# Patient Record
Sex: Female | Born: 1935 | Race: Black or African American | Hispanic: No | Smoking: Never smoker
Health system: Southern US, Community
[De-identification: ages and names within clinical notes are randomized; demographics above are authoritative.]

## PROBLEM LIST (undated history)

## (undated) DIAGNOSIS — D518 Other vitamin B12 deficiency anemias: Secondary | ICD-10-CM

## (undated) DIAGNOSIS — N183 Chronic kidney disease, stage 3 unspecified: Secondary | ICD-10-CM

## (undated) DIAGNOSIS — I739 Peripheral vascular disease, unspecified: Secondary | ICD-10-CM

## (undated) DIAGNOSIS — F29 Unspecified psychosis not due to a substance or known physiological condition: Secondary | ICD-10-CM

## (undated) DIAGNOSIS — E785 Hyperlipidemia, unspecified: Secondary | ICD-10-CM

## (undated) DIAGNOSIS — G309 Alzheimer's disease, unspecified: Secondary | ICD-10-CM

## (undated) DIAGNOSIS — M159 Polyosteoarthritis, unspecified: Secondary | ICD-10-CM

## (undated) DIAGNOSIS — E46 Unspecified protein-calorie malnutrition: Secondary | ICD-10-CM

## (undated) DIAGNOSIS — D509 Iron deficiency anemia, unspecified: Secondary | ICD-10-CM

## (undated) DIAGNOSIS — H04129 Dry eye syndrome of unspecified lacrimal gland: Secondary | ICD-10-CM

## (undated) DIAGNOSIS — I1310 Hypertensive heart and chronic kidney disease without heart failure, with stage 1 through stage 4 chronic kidney disease, or unspecified chronic kidney disease: Secondary | ICD-10-CM

## (undated) DIAGNOSIS — I10 Essential (primary) hypertension: Secondary | ICD-10-CM

## (undated) DIAGNOSIS — J309 Allergic rhinitis, unspecified: Secondary | ICD-10-CM

## (undated) DIAGNOSIS — M810 Age-related osteoporosis without current pathological fracture: Secondary | ICD-10-CM

## (undated) DIAGNOSIS — F0280 Dementia in other diseases classified elsewhere without behavioral disturbance: Secondary | ICD-10-CM

## (undated) HISTORY — DX: Other vitamin B12 deficiency anemias: D51.8

## (undated) HISTORY — DX: Unspecified psychosis not due to a substance or known physiological condition: F29

## (undated) HISTORY — DX: Peripheral vascular disease, unspecified: I73.9

## (undated) HISTORY — DX: Chronic kidney disease, stage 3 (moderate): N18.3

## (undated) HISTORY — DX: Allergic rhinitis, unspecified: J30.9

## (undated) HISTORY — DX: Dementia in other diseases classified elsewhere, unspecified severity, without behavioral disturbance, psychotic disturbance, mood disturbance, and anxiety: F02.80

## (undated) HISTORY — DX: Chronic kidney disease, stage 3 unspecified: N18.30

## (undated) HISTORY — DX: Hyperlipidemia, unspecified: E78.5

## (undated) HISTORY — DX: Age-related osteoporosis without current pathological fracture: M81.0

## (undated) HISTORY — DX: Hypertensive heart and chronic kidney disease without heart failure, with stage 1 through stage 4 chronic kidney disease, or unspecified chronic kidney disease: I13.10

## (undated) HISTORY — DX: Unspecified protein-calorie malnutrition: E46

## (undated) HISTORY — DX: Polyosteoarthritis, unspecified: M15.9

## (undated) HISTORY — DX: Iron deficiency anemia, unspecified: D50.9

## (undated) HISTORY — DX: Essential (primary) hypertension: I10

## (undated) HISTORY — DX: Alzheimer's disease, unspecified: G30.9

## (undated) HISTORY — DX: Dry eye syndrome of unspecified lacrimal gland: H04.129

---

## 2005-07-10 ENCOUNTER — Emergency Department: Payer: Self-pay | Admitting: Emergency Medicine

## 2006-01-26 ENCOUNTER — Other Ambulatory Visit: Payer: Self-pay

## 2006-01-27 ENCOUNTER — Inpatient Hospital Stay: Payer: Self-pay | Admitting: Internal Medicine

## 2006-04-14 ENCOUNTER — Ambulatory Visit: Payer: Self-pay | Admitting: Internal Medicine

## 2006-05-26 ENCOUNTER — Ambulatory Visit: Payer: Self-pay | Admitting: Internal Medicine

## 2008-03-16 ENCOUNTER — Telehealth (INDEPENDENT_AMBULATORY_CARE_PROVIDER_SITE_OTHER): Payer: Self-pay | Admitting: *Deleted

## 2008-04-24 ENCOUNTER — Telehealth (INDEPENDENT_AMBULATORY_CARE_PROVIDER_SITE_OTHER): Payer: Self-pay | Admitting: *Deleted

## 2008-04-24 ENCOUNTER — Encounter (INDEPENDENT_AMBULATORY_CARE_PROVIDER_SITE_OTHER): Payer: Self-pay | Admitting: *Deleted

## 2008-05-23 ENCOUNTER — Telehealth (INDEPENDENT_AMBULATORY_CARE_PROVIDER_SITE_OTHER): Payer: Self-pay | Admitting: *Deleted

## 2008-06-22 DIAGNOSIS — M129 Arthropathy, unspecified: Secondary | ICD-10-CM | POA: Insufficient documentation

## 2008-06-22 DIAGNOSIS — I10 Essential (primary) hypertension: Secondary | ICD-10-CM | POA: Insufficient documentation

## 2008-06-22 DIAGNOSIS — M4000 Postural kyphosis, site unspecified: Secondary | ICD-10-CM | POA: Insufficient documentation

## 2008-06-22 DIAGNOSIS — R634 Abnormal weight loss: Secondary | ICD-10-CM

## 2008-06-22 DIAGNOSIS — M25559 Pain in unspecified hip: Secondary | ICD-10-CM

## 2008-06-23 DIAGNOSIS — M1990 Unspecified osteoarthritis, unspecified site: Secondary | ICD-10-CM | POA: Insufficient documentation

## 2008-07-04 ENCOUNTER — Telehealth: Payer: Self-pay | Admitting: Internal Medicine

## 2008-09-15 ENCOUNTER — Telehealth: Payer: Self-pay | Admitting: Internal Medicine

## 2008-09-29 ENCOUNTER — Ambulatory Visit: Payer: Self-pay | Admitting: Family Medicine

## 2008-09-29 DIAGNOSIS — M533 Sacrococcygeal disorders, not elsewhere classified: Secondary | ICD-10-CM | POA: Insufficient documentation

## 2008-09-29 DIAGNOSIS — M810 Age-related osteoporosis without current pathological fracture: Secondary | ICD-10-CM | POA: Insufficient documentation

## 2008-10-10 ENCOUNTER — Ambulatory Visit: Payer: Self-pay | Admitting: Family Medicine

## 2008-10-10 LAB — CONVERTED CEMR LAB

## 2008-10-18 ENCOUNTER — Telehealth: Payer: Self-pay | Admitting: Family Medicine

## 2008-10-20 DIAGNOSIS — E875 Hyperkalemia: Secondary | ICD-10-CM

## 2008-10-20 LAB — CONVERTED CEMR LAB
ALT: 12 units/L (ref 0–35)
AST: 21 units/L (ref 0–37)
Basophils Absolute: 0.1 10*3/uL (ref 0.0–0.1)
Bilirubin, Direct: 0.1 mg/dL (ref 0.0–0.3)
CO2: 24 meq/L (ref 19–32)
Chloride: 113 meq/L — ABNORMAL HIGH (ref 96–112)
Eosinophils Absolute: 0.1 10*3/uL (ref 0.0–0.7)
GFR calc non Af Amer: 41 mL/min
HDL: 64.9 mg/dL (ref >39.0)
Lymphocytes Relative: 16 % (ref 12.0–46.0)
MCHC: 33.7 g/dL (ref 30.0–36.0)
Neutrophils Relative %: 73 % (ref 43.0–77.0)
Platelets: 208 10*3/uL (ref 150–400)
Potassium: 5.6 meq/L — ABNORMAL HIGH (ref 3.5–5.1)
RBC: 3.85 M/uL — ABNORMAL LOW (ref 3.87–5.11)
RDW: 12.2 % (ref 11.5–14.6)
Sodium: 143 meq/L (ref 135–145)
Total Bilirubin: 0.8 mg/dL (ref 0.3–1.2)
Triglycerides: 128 mg/dL (ref 0–149)
VLDL: 26 mg/dL (ref 0–40)

## 2008-11-01 ENCOUNTER — Ambulatory Visit: Payer: Self-pay | Admitting: Family Medicine

## 2008-11-01 DIAGNOSIS — E538 Deficiency of other specified B group vitamins: Secondary | ICD-10-CM | POA: Insufficient documentation

## 2008-11-02 LAB — CONVERTED CEMR LAB
CO2: 24 meq/L (ref 19–32)
Calcium: 9.8 mg/dL (ref 8.4–10.5)
GFR calc Af Amer: 39 mL/min
Glucose, Bld: 91 mg/dL (ref 70–99)
Potassium: 5.4 meq/L — ABNORMAL HIGH (ref 3.5–5.1)
Sodium: 142 meq/L (ref 135–145)

## 2008-11-12 DIAGNOSIS — F068 Other specified mental disorders due to known physiological condition: Secondary | ICD-10-CM

## 2009-01-02 ENCOUNTER — Telehealth: Payer: Self-pay | Admitting: Family Medicine

## 2009-02-01 ENCOUNTER — Ambulatory Visit: Payer: Self-pay | Admitting: Family Medicine

## 2009-02-01 DIAGNOSIS — R413 Other amnesia: Secondary | ICD-10-CM | POA: Insufficient documentation

## 2009-02-01 DIAGNOSIS — R609 Edema, unspecified: Secondary | ICD-10-CM

## 2009-02-05 LAB — CONVERTED CEMR LAB
BUN: 27 mg/dL — ABNORMAL HIGH (ref 6–23)
Chloride: 108 meq/L (ref 96–112)
GFR calc non Af Amer: 45.16 mL/min (ref >60)
Glucose, Bld: 94 mg/dL (ref 70–99)
Potassium: 4.4 meq/L (ref 3.5–5.1)
Sodium: 142 meq/L (ref 135–145)
Vitamin B-12: 323 pg/mL (ref 211–911)

## 2009-03-12 ENCOUNTER — Telehealth: Payer: Self-pay | Admitting: Family Medicine

## 2009-06-02 ENCOUNTER — Encounter: Payer: Self-pay | Admitting: Internal Medicine

## 2009-06-02 ENCOUNTER — Inpatient Hospital Stay (HOSPITAL_COMMUNITY): Admission: EM | Admit: 2009-06-02 | Discharge: 2009-06-06 | Payer: Self-pay | Admitting: Emergency Medicine

## 2009-06-02 DIAGNOSIS — R5383 Other fatigue: Secondary | ICD-10-CM

## 2009-06-02 DIAGNOSIS — E876 Hypokalemia: Secondary | ICD-10-CM

## 2009-06-02 DIAGNOSIS — R5381 Other malaise: Secondary | ICD-10-CM

## 2009-06-02 DIAGNOSIS — E860 Dehydration: Secondary | ICD-10-CM | POA: Insufficient documentation

## 2009-06-02 DIAGNOSIS — D72829 Elevated white blood cell count, unspecified: Secondary | ICD-10-CM | POA: Insufficient documentation

## 2009-06-04 ENCOUNTER — Ambulatory Visit: Payer: Self-pay | Admitting: Internal Medicine

## 2009-06-06 ENCOUNTER — Encounter: Payer: Self-pay | Admitting: Internal Medicine

## 2009-06-14 ENCOUNTER — Telehealth: Payer: Self-pay | Admitting: Family Medicine

## 2009-06-14 DIAGNOSIS — N814 Uterovaginal prolapse, unspecified: Secondary | ICD-10-CM | POA: Insufficient documentation

## 2009-06-17 ENCOUNTER — Encounter: Payer: Self-pay | Admitting: Internal Medicine

## 2009-06-23 ENCOUNTER — Emergency Department: Payer: Self-pay | Admitting: Unknown Physician Specialty

## 2009-07-06 ENCOUNTER — Emergency Department: Payer: Self-pay | Admitting: Emergency Medicine

## 2009-07-18 ENCOUNTER — Inpatient Hospital Stay: Payer: Self-pay | Admitting: Internal Medicine

## 2009-11-25 IMAGING — CR DG ABDOMEN 1V
1 series · 1 of 1 positions shown · non-contrast
Comparison: none

REASON FOR EXAM: clinical rectal prolapse
COMMENTS:   LMP: Post-Menopausal

PROCEDURE:     DXR - DXR KIDNEY URETER BLADDER  - July 06, 2009  [DATE]
RESULT:     Comparisons:  None

[view not recorded]
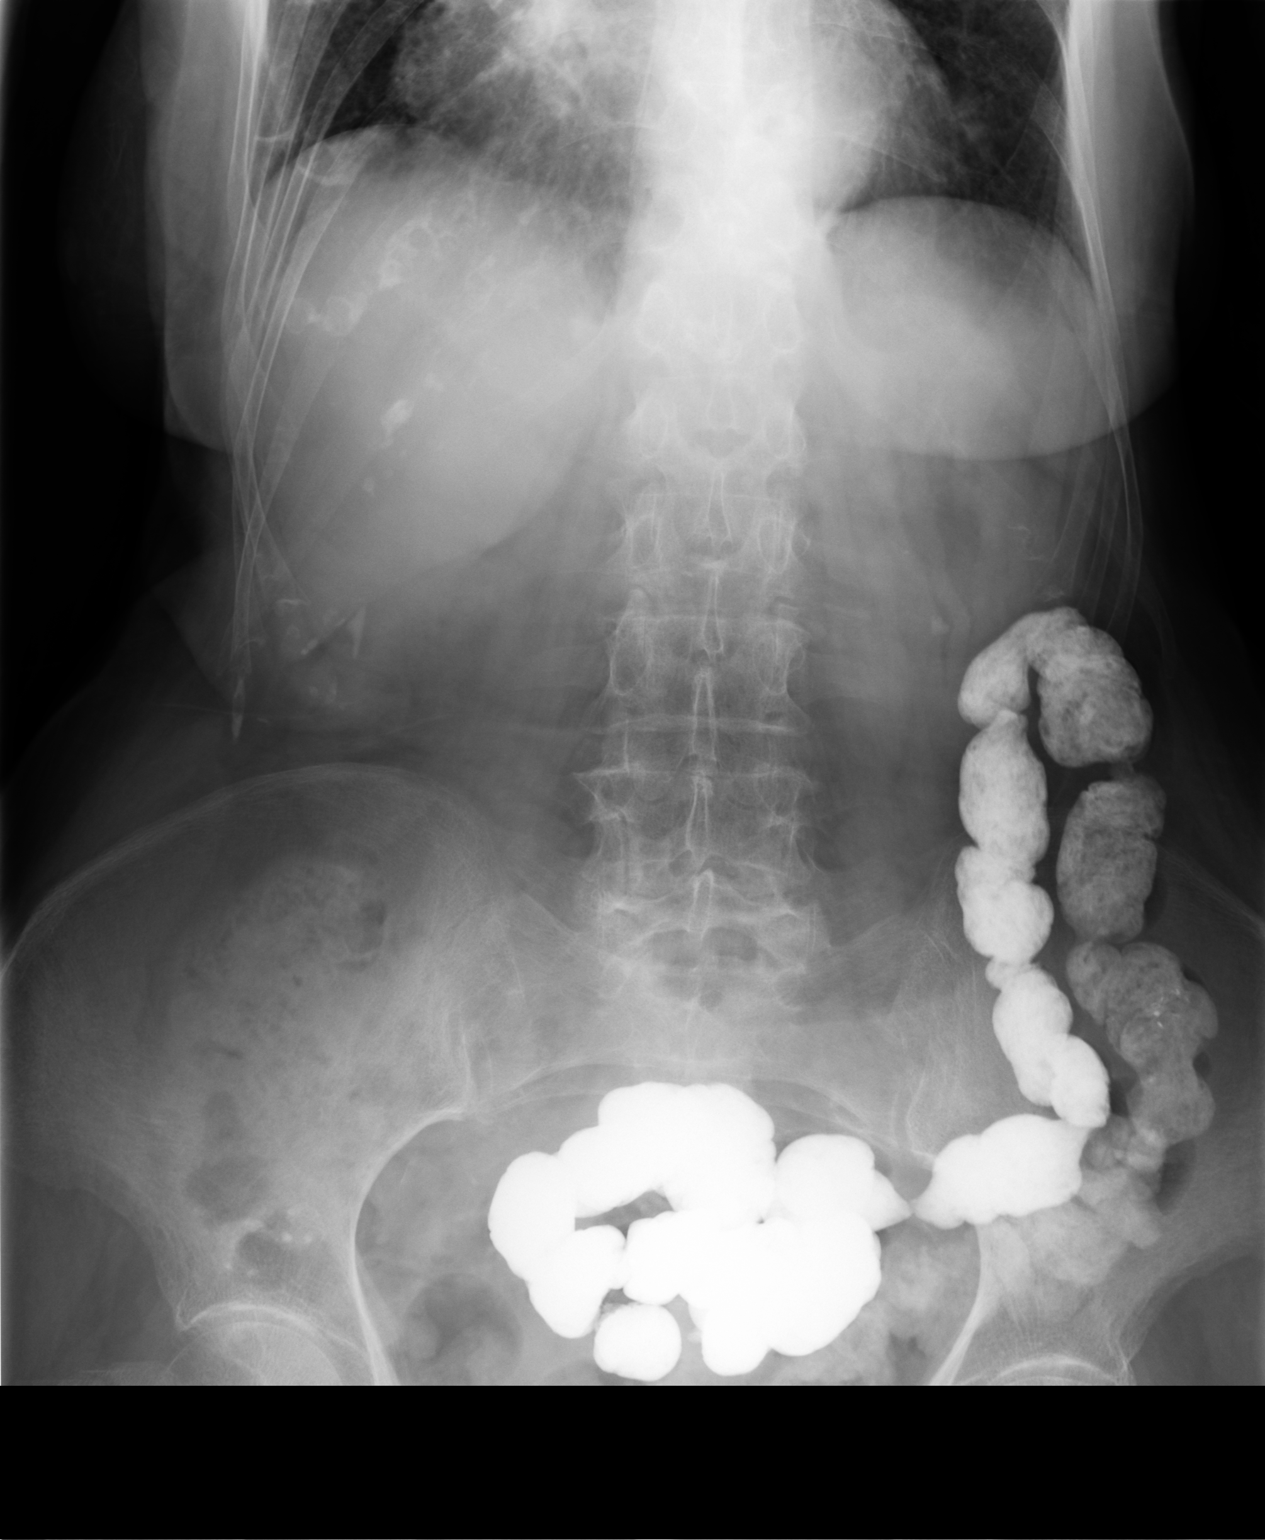

[1 of 1 positions shown; findings below may reference images not displayed]

FINDINGS: Supine radiograph of the abdomen is provided.

There is a nonspecific bowel gas pattern. There is no bowel dilatation to
suggest obstruction. There is contrast material within the rectum, sigmoid
colon, and distal descending colon which may be from prior oral contrast
material administration versus inspissated stool. There is no pathologic
calcification along the expected course of the ureters. There is no evidence
of pneumoperitoneum, portal venous gas, or pneumatosis.

The osseous structures are unremarkable.
IMPRESSION: There is contrast material within the rectum, sigmoid colon, and distal
descending colon which may be from prior oral contrast material
administration versus inspissated stool.

## 2011-02-23 LAB — DIFFERENTIAL
Basophils Absolute: 0.1 10*3/uL (ref 0.0–0.1)
Eosinophils Relative: 0 % (ref 0–5)
Lymphocytes Relative: 7 % — ABNORMAL LOW (ref 12–46)
Lymphs Abs: 0.8 10*3/uL (ref 0.7–4.0)
Monocytes Absolute: 0.9 10*3/uL (ref 0.1–1.0)
Neutro Abs: 9.5 10*3/uL — ABNORMAL HIGH (ref 1.7–7.7)

## 2011-02-23 LAB — URINALYSIS, ROUTINE W REFLEX MICROSCOPIC
Bilirubin Urine: NEGATIVE
Nitrite: NEGATIVE
Specific Gravity, Urine: 1.017 (ref 1.005–1.030)
Urobilinogen, UA: 0.2 mg/dL (ref 0.0–1.0)
pH: 5.5 (ref 5.0–8.0)

## 2011-02-23 LAB — BASIC METABOLIC PANEL
BUN: 12 mg/dL (ref 6–23)
CO2: 24 mEq/L (ref 19–32)
CO2: 27 mEq/L (ref 19–32)
Calcium: 7.9 mg/dL — ABNORMAL LOW (ref 8.4–10.5)
Calcium: 7.9 mg/dL — ABNORMAL LOW (ref 8.4–10.5)
Chloride: 107 mEq/L (ref 96–112)
Chloride: 115 mEq/L — ABNORMAL HIGH (ref 96–112)
Creatinine, Ser: 1.07 mg/dL (ref 0.4–1.2)
Creatinine, Ser: 1.24 mg/dL — ABNORMAL HIGH (ref 0.4–1.2)
GFR calc Af Amer: 50 mL/min — ABNORMAL LOW (ref >60)
GFR calc Af Amer: 59 mL/min — ABNORMAL LOW (ref >60)
Potassium: 3.5 mEq/L (ref 3.5–5.1)
Sodium: 138 mEq/L (ref 135–145)
Sodium: 139 mEq/L (ref 135–145)

## 2011-02-23 LAB — COMPREHENSIVE METABOLIC PANEL
AST: 53 U/L — ABNORMAL HIGH (ref 0–37)
Albumin: 3.1 g/dL — ABNORMAL LOW (ref 3.5–5.2)
BUN: 16 mg/dL (ref 6–23)
Calcium: 8.8 mg/dL (ref 8.4–10.5)
Creatinine, Ser: 1.32 mg/dL — ABNORMAL HIGH (ref 0.4–1.2)
GFR calc Af Amer: 46 mL/min — ABNORMAL LOW (ref >60)
Total Bilirubin: 0.6 mg/dL (ref 0.3–1.2)
Total Protein: 6.2 g/dL (ref 6.0–8.3)

## 2011-02-23 LAB — CBC
HCT: 30.3 % — ABNORMAL LOW (ref 36.0–46.0)
HCT: 34.2 % — ABNORMAL LOW (ref 36.0–46.0)
Hemoglobin: 10 g/dL — ABNORMAL LOW (ref 12.0–15.0)
MCHC: 32.6 g/dL (ref 30.0–36.0)
MCHC: 32.8 g/dL (ref 30.0–36.0)
MCHC: 33.3 g/dL (ref 30.0–36.0)
MCV: 90.9 fL (ref 78.0–100.0)
MCV: 91 fL (ref 78.0–100.0)
MCV: 91.1 fL (ref 78.0–100.0)
Platelets: 264 10*3/uL (ref 150–400)
Platelets: 303 10*3/uL (ref 150–400)
RBC: 3.34 MIL/uL — ABNORMAL LOW (ref 3.87–5.11)
RBC: 3.5 MIL/uL — ABNORMAL LOW (ref 3.87–5.11)
RDW: 13.5 % (ref 11.5–15.5)
WBC: 11.4 10*3/uL — ABNORMAL HIGH (ref 4.0–10.5)
WBC: 14.1 10*3/uL — ABNORMAL HIGH (ref 4.0–10.5)
WBC: 9.1 10*3/uL (ref 4.0–10.5)

## 2011-02-23 LAB — URINE CULTURE: Colony Count: NO GROWTH

## 2011-02-23 LAB — CK TOTAL AND CKMB (NOT AT ARMC)
CK, MB: 22.2 ng/mL — ABNORMAL HIGH (ref 0.3–4.0)
Relative Index: 3.5 — ABNORMAL HIGH (ref 0.0–2.5)

## 2011-02-23 LAB — SEDIMENTATION RATE: Sed Rate: 33 mm/hr — ABNORMAL HIGH (ref 0–22)

## 2011-02-23 LAB — TSH: TSH: 4.748 u[IU]/mL — ABNORMAL HIGH (ref 0.350–4.500)

## 2011-03-06 ENCOUNTER — Inpatient Hospital Stay (HOSPITAL_COMMUNITY)
Admission: EM | Admit: 2011-03-06 | Discharge: 2011-03-08 | DRG: 812 | Disposition: A | Discharge status: Skilled Nursing Facility | Payer: Medicare Other | Attending: Family Medicine | Admitting: Family Medicine

## 2011-03-06 DIAGNOSIS — G309 Alzheimer's disease, unspecified: Secondary | ICD-10-CM | POA: Diagnosis present

## 2011-03-06 DIAGNOSIS — F0280 Dementia in other diseases classified elsewhere without behavioral disturbance: Secondary | ICD-10-CM | POA: Diagnosis present

## 2011-03-06 DIAGNOSIS — K623 Rectal prolapse: Secondary | ICD-10-CM | POA: Diagnosis present

## 2011-03-06 DIAGNOSIS — K5900 Constipation, unspecified: Secondary | ICD-10-CM | POA: Diagnosis present

## 2011-03-06 DIAGNOSIS — N183 Chronic kidney disease, stage 3 unspecified: Secondary | ICD-10-CM | POA: Diagnosis present

## 2011-03-06 DIAGNOSIS — Z8673 Personal history of transient ischemic attack (TIA), and cerebral infarction without residual deficits: Secondary | ICD-10-CM

## 2011-03-06 DIAGNOSIS — Z66 Do not resuscitate: Secondary | ICD-10-CM | POA: Diagnosis present

## 2011-03-06 DIAGNOSIS — I129 Hypertensive chronic kidney disease with stage 1 through stage 4 chronic kidney disease, or unspecified chronic kidney disease: Secondary | ICD-10-CM | POA: Diagnosis present

## 2011-03-06 DIAGNOSIS — N390 Urinary tract infection, site not specified: Secondary | ICD-10-CM | POA: Diagnosis present

## 2011-03-06 DIAGNOSIS — D531 Other megaloblastic anemias, not elsewhere classified: Principal | ICD-10-CM | POA: Diagnosis present

## 2011-03-06 LAB — VITAMIN B12: Vitamin B-12: 204 pg/mL — ABNORMAL LOW (ref 211–911)

## 2011-03-06 LAB — COMPREHENSIVE METABOLIC PANEL
ALT: 11 U/L (ref 0–35)
AST: 16 U/L (ref 0–37)
Albumin: 3.3 g/dL — ABNORMAL LOW (ref 3.5–5.2)
Alkaline Phosphatase: 65 U/L (ref 39–117)
GFR calc non Af Amer: 37 mL/min — ABNORMAL LOW (ref >60)
Glucose, Bld: 72 mg/dL (ref 70–99)
Total Bilirubin: 0.5 mg/dL (ref 0.3–1.2)

## 2011-03-06 LAB — URINALYSIS, ROUTINE W REFLEX MICROSCOPIC
Bilirubin Urine: NEGATIVE
Hgb urine dipstick: NEGATIVE
Ketones, ur: NEGATIVE mg/dL
Nitrite: NEGATIVE
Specific Gravity, Urine: 1.019 (ref 1.005–1.030)
Urobilinogen, UA: 0.2 mg/dL (ref 0.0–1.0)

## 2011-03-06 LAB — APTT: aPTT: 29 seconds (ref 24–37)

## 2011-03-06 LAB — URINE MICROSCOPIC-ADD ON

## 2011-03-06 LAB — IRON AND TIBC

## 2011-03-06 LAB — DIFFERENTIAL
Eosinophils Absolute: 0.3 10*3/uL (ref 0.0–0.7)
Eosinophils Relative: 3 % (ref 0–5)
Lymphs Abs: 1 10*3/uL (ref 0.7–4.0)
Monocytes Relative: 11 % (ref 3–12)
Neutrophils Relative %: 76 % (ref 43–77)

## 2011-03-06 LAB — CBC
MCH: 23.6 pg — ABNORMAL LOW (ref 26.0–34.0)
MCHC: 29.7 g/dL — ABNORMAL LOW (ref 30.0–36.0)
Platelets: 396 10*3/uL (ref 150–400)

## 2011-03-06 LAB — SAMPLE TO BLOOD BANK

## 2011-03-06 LAB — FOLATE: Folate: 12.4 ng/mL

## 2011-03-06 LAB — PROTIME-INR: INR: 1.01 (ref 0.00–1.49)

## 2011-03-06 LAB — FERRITIN: Ferritin: 6 ng/mL — ABNORMAL LOW (ref 10–291)

## 2011-03-07 LAB — COMPREHENSIVE METABOLIC PANEL
Albumin: 2.6 g/dL — ABNORMAL LOW (ref 3.5–5.2)
BUN: 26 mg/dL — ABNORMAL HIGH (ref 6–23)
Calcium: 8.3 mg/dL — ABNORMAL LOW (ref 8.4–10.5)
Creatinine, Ser: 1.14 mg/dL (ref 0.4–1.2)
GFR calc Af Amer: 54 mL/min — ABNORMAL LOW (ref >60)
Total Bilirubin: 0.5 mg/dL (ref 0.3–1.2)
Total Protein: 5.3 g/dL — ABNORMAL LOW (ref 6.0–8.3)

## 2011-03-07 LAB — CBC
HCT: 33.4 % — ABNORMAL LOW (ref 36.0–46.0)
Hemoglobin: 10.4 g/dL — ABNORMAL LOW (ref 12.0–15.0)
MCH: 25.4 pg — ABNORMAL LOW (ref 26.0–34.0)
MCHC: 31.4 g/dL (ref 30.0–36.0)
MCV: 81 fL (ref 78.0–100.0)
Platelets: 296 10*3/uL (ref 150–400)
RBC: 4.12 MIL/uL (ref 3.87–5.11)
RDW: 14.9 % (ref 11.5–15.5)
RDW: 14.9 % (ref 11.5–15.5)
WBC: 11 10*3/uL — ABNORMAL HIGH (ref 4.0–10.5)

## 2011-03-07 LAB — HEMOCCULT GUIAC POC 1CARD (OFFICE): Fecal Occult Bld: NEGATIVE

## 2011-03-07 NOTE — H&P (Signed)
Janice Turner, BIDDLE              ACCOUNT NO.:  [id number]  MEDICAL RECORD NO.:  [id number]           PATIENT TYPE:  E  LOCATION:  WLED                         FACILITY:  [facility]  PHYSICIAN:  Clydia Llano, MD       DATE OF BIRTH:  1919-12-15  DATE OF ADMISSION:  03/06/2011 DATE OF DISCHARGE:                             HISTORY & PHYSICAL   PRIMARY CARE PHYSICIAN:  Dr. Murray Hodgkins.  REASON FOR ADMISSION:  Anemia.  HISTORY OF PRESENT ILLNESS:  Janice Turner is an 77 year old Caucasian female.  The patient is a nursing home resident with dementia, hypertension, and chronic kidney disease stage 3.  The patient was brought to the hospital because of anemia found in the lab results.  The patient for the past few days prior to admission, she was noticed by the nursing staff in the nursing home to have progressive weakness.  So, blood work was done today and it showed hemoglobin of 7.0.  So, the patient transferred to the emergency department for further evaluation. The patient is demented, could not give any history, but according to the daughter, the patient does not have any recent fever, chills, or any other complaints apart from feeling weak.  The patient is known to have chronic kidney disease, probably stage 3.  Upon initial evaluation in the emergency department, the hemoglobin is 7.0.  The patient also found to have UA consistent with UTI.  The patient will be admitted for further evaluation.  PAST MEDICAL HISTORY: 1. Alzheimer dementia. 2. Hypertension. 3. Chronic constipation. 4. Chronic kidney disease, stage 3. 5. Osteoarthritis. 6. Allergic rhinitis. 7. Rectal prolapse. 8. History of recurrent UTIs. 9. History of cerebrovascular accident. 10.Remote history of MRSA UTI.  SOCIAL HISTORY:  The patient lives in [facility].  There is no history of smoking.  No history of alcohol or street drugs.  FAMILY HISTORY:  There is no history of colon  cancer.  ALLERGIES:  NKDA.  MEDICATIONS: 1. Loratadine 10 mg p.o. daily. 2. Systane 1 drop in both eyes 4 times daily. 3. Namenda 10 mg p.o. b.i.d. 4. Naproxen 220 mg p.o. b.i.d. 5. Aricept ODT 10 mg p.o. daily at bedtime. 6. Calcium carbonate with vitamin D over-the-counter 1 tablet p.o. 3     times a day. 7. Metoprolol tartrate 25 mg half-tablet p.o. b.i.d. 8. Senna/docusate 8.6/50 mg p.o. b.i.d. 9. MiraLax 17 g p.o. daily as needed for constipation.  REVIEW OF SYSTEMS:  GENERAL:  The patient has generalized weakness. Denies recent weight loss.  ENT:  Denies any sore throat.  Denies blurred vision.  MOUTH:  Denies any difficulty swallowing.  NECK: Denies any neck stiffness.  RESPIRATORY:  Denies any shortness of breath or wheezes.  CARDIOVASCULAR:  Denies chest pain or palpitations.  GI: Denies nausea, vomiting, or diarrhea.  GU:  Denies difficulty in passing urine.  MUSCULOSKELETAL:  Denies back pain.  Denies any joint swelling. HEMATOLOGY:  Denies recent blood transfusion or easy bruising. NEUROLOGIC:  The patient is demented, but denies recent focal weakness. SKIN:  Denies any suspicious lesions.  PHYSICAL EXAMINATION:  VITAL SIGNS:  Temperature  is 97.5, respirations 18, pulse rate is 71, and blood pressure is 145/76. GENERAL:  The patient is an elderly Caucasian female in no acute distress, wears oxygen through nasal cannula, looks calm. HEAD AND FACE:  Normocephalic and atraumatic. EYES:  Pupils equal, reactive to light and accommodation.  No scleral icterus. ENT:  Ears, nose, and throat normal. MOUTH:  Without oral thrush or lesions. NECK:  Supple.  No masses, no JVD or lymphadenopathy appreciated. CARDIOVASCULAR:  Regular rate and rhythm.  No murmurs, rubs, or gallops. RESPIRATORY:  Breath sounds equal bilaterally.  No rales, rhonchi, or wheezes. ABDOMEN:  Bowel sounds heard.  Soft, nontender, and nondistended.  No hepatosplenomegaly. EXTREMITIES:  Normal pulse.  No  pedal edema. NEUROLOGIC:  Motor intact in all extremities.  Sensation is normal. There is normal speech and no facial droop. SKIN:  Color normal.  Warm and dry. PSYCHIATRY:  No abnormality of mood or affect.  Cardiac EKG, normal sinus rhythm.  LABORATORY DATA: 1. CBC, WBCs 9.4, hemoglobin 7.0, hematocrit 23.6, MCV 79.5, and     platelets 396. 2. BMP, sodium 143, potassium 4.4, chloride 108, bicarbonate is 25,     glucose 72, BUN 35, and creatinine 1.35. 3. LFTs, AST 16, ALT 11, albumin is 3.3, total bilirubin is 0.5, and     alkaline phosphatase is 65. 4. UA showed moderate leukocyte esterase and pyuria with wbc's of 11     to 20. 5. Miscellaneous, INR is 1.0.  Fecal occult blood is negative x1.  ASSESSMENT AND PLAN: 1. Anemia.  The patient will be admitted to the hospital telemetry     bed.  Fecal occult blood will be sent x3.  Anemia panel also     ordered.  Per ED physician, there is order for transfuse.  We will     monitor.  If the fecal occult blood turns positive, we will     consider GI evaluation.  Anemia panel also will be done.  We will     monitor closely.  We will do CBCs twice a day. 2. Urinary tract infection.  The patient started on Rocephin.  We will     follow up on urine culture.  The patient does not seem she is too     sick with regular white blood count and no fever or chills. 3. Dementia.  I will continue her home medications. 4. Chronic kidney disease.  The patient seems like she is close to her     baseline.  Creatinine 1.35 today, which correlates with chronic     kidney disease stage 3.  The patient will have some IV hydration     and we will check     BMP in the morning.  The chronic kidney disease might contribute     actually to her anemia some degree.  We will monitor. 5. Advance directives.  Discussed with the patient's daughter.  The     patient is do not resuscitate.     Clydia Llano, MD     ME/MEDQ  D:  03/06/2011  T:  03/06/2011  Job:   [id number]  cc:   Lenon Curt. Chilton Si, M.D. Fax: [phone]  Electronically Signed by Clydia Llano  on 03/07/2011 03:25:08 PM

## 2011-03-08 LAB — CBC
Hemoglobin: 10.3 g/dL — ABNORMAL LOW (ref 12.0–15.0)
Hemoglobin: 9.9 g/dL — ABNORMAL LOW (ref 12.0–15.0)
MCH: 24.9 pg — ABNORMAL LOW (ref 26.0–34.0)
MCH: 25.1 pg — ABNORMAL LOW (ref 26.0–34.0)
MCHC: 30.5 g/dL (ref 30.0–36.0)
MCV: 81.5 fL (ref 78.0–100.0)
RBC: 3.94 MIL/uL (ref 3.87–5.11)
RDW: 15.2 % (ref 11.5–15.5)

## 2011-03-08 LAB — CROSSMATCH
ABO/RH(D): A POS
Unit division: 0

## 2011-03-08 LAB — URINE CULTURE
Culture  Setup Time: 201204200115
Culture: NO GROWTH

## 2011-03-08 LAB — RETICULOCYTES: Retic Ct Pct: 1.3 % (ref 0.4–3.1)

## 2011-03-09 NOTE — Discharge Summary (Signed)
NAMEKURT, AZIMI              ACCOUNT NO.:  [id number]  MEDICAL RECORD NO.:  [id number]           PATIENT TYPE:  I  LOCATION:  1412                         FACILITY:  [facility]  PHYSICIAN:  Pleas Koch, MD        DATE OF BIRTH:  09-09-1920  DATE OF ADMISSION:  03/06/2011 DATE OF DISCHARGE:                              DISCHARGE SUMMARY   DISCHARGE DIAGNOSES: 1. Severe anemia, likely combined iron and B12 deficiency. 2. Possible urinary tract infection, more likely asymptomatic     bacteria. 3. Chronic kidney disease stage 3. 4. History of cerebrovascular accident. 5. Hypertension. 6. Dementia. 7. Chronic constipation with rectal prolapse.  DISCHARGE MEDICATIONS: 1. Metoprolol 12.5 mg b.i.d. 2. MiraLax 17 g daily p.r.n. 3. Senokot-S 8.6/50 one tablet b.i.d. 4. Namenda 10 mg p.o. b.i.d. 5. New medication ferrous sulfate 325 b.i.d., 52-month supply, 60     tablets prescribed. 6. I have discontinued her Naprosyn as well as her donepezil     (donepezil apparently gives her allergies). 7. Calcium carbonate 1 tablet t.i.d. 8. Artane 10 mg daily. 9. Systane eyedrops, both eyes, 1 drop q.i.d. 10.New medication, cyanocobalamin 1000 mcg daily for 1 month.  PERTINENT IMAGING STUDIES: 1. Small bilateral pleural effusions without convincing airway     disease. 2. Stable cardiomegaly with Severe thoracic kyphosis.  Please see full HPI #[id number] by Dr. Arthor Captain dated March 06, 2011. Briefly, this is 77 year old female, resident of nursing home with dementia, hypertension, chronic kidney disease was brought to hospital because of lab results showing hemoglobin 7 according to her daughter whose same is Cornelia, phone number [phone].  She did not have any recent fever or chills but has complained of feeling weak.  The patient was admitted also with UA consistent with UTI.  PROBLEM LIST ACCORDING TO HOSPITAL ISSUE: 1. Anemia.  The patient was admitted to tele bed.  She was transfused   2 units of packed red blood cells and hemoglobin went up to high of     10.3.  Fecal occult blood was done, which was negative.  Anemia     panel was done, which also showed iron level of less than 10, B12     of 204.  It was felt more likely or not she had chronic iron-     deficiency anemia likely secondary to poor nutritional intake and     has been kept on ferrous sulfate 325 b.i.d. with 39-month supply.     She has also been placed on B12, 204.  Reticulocyte count was done,     which showed her retic count at 1.3, which is sufficient.  She is     making enough blood. Although her reticulocyte index was less than     2, which would indicate she has hypoproliferation.  Nevertheless, I     did discuss with her daughter further care in terms of whether they     would want the anemia worked up further and in terms of her bone     marrow biopsy or colonoscopy if indeed her guaiac came up positive     and they  responded that this would not be the case.  As such, we     will discharge her home as she is relatively stable from     hemodynamic standpoint. 2. The patient received 1 dose of Rocephin in the ED, but she did not     have any indices of infection to begin with on admission and likely     this was unclean specimen that was caught.  I am not discharging     her home on any medications.  Her temperature on admission was     97.5, and she did not have a white count. 3. CKD stage 3.  This looked like it was stable.  Initially, her     creatinine was 1.35 and on discharge day after being admitted it     was 1.14. 4. History of CVA.  She is not a candidate for Plavix or aspirin     secondary to acute fall and will need to be monitored. 5. Hypertension.  We will continue metoprolol 12.5 b.i.d. 6. Dementia.  She will continue her Namenda at her home dosage.  She     apparently is allergic to Aricept and should not be placed on this. 7. Chronic constipation with rectal prolapse.  She is not  a surgical     candidate, but we will continue on bowel regimen as described     above. 8. I did discuss with her daughter extensively course of care and     options for care, and I have recommended that she return to her     facility.  It would be beneficial to have goals of care meeting     with hospice palliative care as an outpatient, but I will leave     that up to accepting physician at nursing facility.  It was a pleasure taking care of this patient.          ______________________________ Pleas Koch, MD     JS/MEDQ  D:  03/08/2011  T:  03/08/2011  Job:  [id number]  Electronically Signed by Pleas Koch MD on 03/09/2011 12:47:21 PM

## 2011-03-11 LAB — INTRINSIC FACTOR ANTIBODIES: Intrinsic Factor: NEGATIVE

## 2011-04-01 NOTE — Discharge Summary (Signed)
Janice Turner, Janice Turner              ACCOUNT NO.:  [id number]   MEDICAL RECORD NO.:  [id number]          PATIENT TYPE:  INP   LOCATION:  1317                         [facility]ACILITY:  [facility]   PHYSICIAN:  Corwin Levins, MD      DATE OF BIRTH:  11-08-1920   DATE OF ADMISSION:  06/02/2009  DATE OF DISCHARGE:                               DISCHARGE SUMMARY   DATE OF DISCHARGE:  Anticipated discharge in the next 1-2 days to a  skilled nursing facility.   DISCHARGE DIAGNOSES:  1. Early left lower lobe pneumonia.  2. Leukocytosis, likely associated.  3. Anemia.  4. Failure to thrive.  5. Dementia.  6. Hypothyroidism.  7. Transient back pain.  8. B12 deficiency.  9. Osteoporosis.  10.Osteoarthritis.  11.Hypertension.   PROCEDURES:  June 03, 2009 - lumbar spine with osteopenia and mild  spondylosis with degenerative anterolisthesis at L4-L5.   CONSULTATIONS:  None.   HISTORY AND PHYSICAL:  See that documented per Dr. Darryll Capers on date  of admission per [facility] EMR.   HOSPITAL COURSE:  Janice Turner is an 77 year old white female who presented  the date of admission with signs and symptoms consistent with failure to  thrive with severe malaise and fatigue complicated by dehydration and  probable malnutrition.  There was some mild leukocytosis.  Initial chest  x-ray and UA was negative.  She was afebrile, and the initial plan was  to check him repeat chest x-ray after hydration and start with PT, OT  and possible skilled nursing facility placement.  There was some  transient lower back discomfort.  LS spine films obtained as above with  no acute fracture noted.  There was no further discomfort during her  hospitalization.  With hydration, she did manage to become oriented to  name and place and was able to start cooperating with PT and OT.  Initial portable chest x-ray showed a questionable right paratracheal  density.  Follow-up chest x-ray did not show a significant  abnormality.  She was begun on 25 mcg of levothyroxine for TSH slightly elevated,  consistent with subclinical hypothyroidism.  With hydration, her  hemoglobin settled to 10.0 on the day of dictation.  Leukocytosis was  improving with final white blood cell count of 11.8, platelets 269.  Electrolytes within normal limits.  BUN 16, creatinine 1.22, glucose  slightly elevated at 126.  The day prior to this dictation, white blood  cell count had risen to 14, and although she was afebrile, she did have  evidence of acute left lower lobe rales.  She was started on Avelox  p.o., and she visibly felt weaker and appeared to be having chills,  although she states she only felt cold.  There was no chest pain,  cough, shortness of breath or wheezing.  On the day of dictation, she is  sleepy but arousable.  Oriented to name and place.  No new complaints.  Denies any pain, shortness of breath, wheezing or other concerns such as  constipation.  Left lower lobe rales seemed to have resolved.  Blood  pressure did seem  somewhat labile with blood pressure 160/100 to 102/58  which was non postural.  She denied any dizziness or weakness.  Exam was  otherwise benign.  Follow-up chest x-ray showed small bilateral pleural  effusions only.  As she was afebrile, clinically improved with respect  to probable early pneumonia on the Avelox p.o., starting to improve with  PT and OT with respect to the failure to thrive, anemia stable, and  mental status somewhat improved, it was felt she had gained maximum  benefit from this hospitalization and is to be transferred soon  anticipated to a skilled nursing facility.  Social services has been in  contact with the daughter, and discharge process for skilled nursing  facility is being done.   DISPOSITION:  Transfer to skilled nursing facility when able.   DIET:  Heart-healthy diet.   PT and OT to continue at the skilled nursing facility.   DISCHARGE MEDICATIONS:  1.  Avelox 400 mg p.o. daily to finish a 10-day course.  2. Remeron 15 mg p.o. q.h.s.  3. Aricept 5 mg p.o. q.h.s.  4. Lopressor 25 mg p.o. b.i.d.  5. Lovaza 1 gram p.o. b.i.d.  6. Lyrica 50 mg p.o. b.i.d.  7. Levothyroxine 25 mcg p.o. daily.  8. Ensure one p.o. t.i.d. between meals.  9. Tylenol Extra Strength 500 mg two p.o. t.i.d.  10.Phenergan 12.5 mg p.o. q.6h. p.r.n.  11.Calcium 600/vitamin D 600-400 one p.o. q.12h.  12.Vitamin B12 one p.o. daily.  13.Vitamin C 500 mg one p.o. b.i.d.  14.Zinc 220 mg one p.o. daily.  15.Boniva 150 mg p.o. q. month.  16.Lasix 20 mg p.o. daily p.r.n. swelling.      Corwin Levins, MD  Electronically Signed     JWJ/MEDQ  D:  06/06/2009  T:  06/06/2009  Job:  [id number]

## 2011-08-24 ENCOUNTER — Emergency Department: Payer: Self-pay | Admitting: Emergency Medicine

## 2013-02-18 DIAGNOSIS — N183 Chronic kidney disease, stage 3 (moderate): Secondary | ICD-10-CM

## 2013-02-18 DIAGNOSIS — M549 Dorsalgia, unspecified: Secondary | ICD-10-CM

## 2013-02-18 DIAGNOSIS — I1310 Hypertensive heart and chronic kidney disease without heart failure, with stage 1 through stage 4 chronic kidney disease, or unspecified chronic kidney disease: Secondary | ICD-10-CM

## 2013-02-18 DIAGNOSIS — J309 Allergic rhinitis, unspecified: Secondary | ICD-10-CM

## 2013-02-18 DIAGNOSIS — D509 Iron deficiency anemia, unspecified: Secondary | ICD-10-CM

## 2013-03-18 DIAGNOSIS — M159 Polyosteoarthritis, unspecified: Secondary | ICD-10-CM

## 2013-03-18 DIAGNOSIS — L8995 Pressure ulcer of unspecified site, unstageable: Secondary | ICD-10-CM

## 2013-03-18 DIAGNOSIS — E785 Hyperlipidemia, unspecified: Secondary | ICD-10-CM

## 2013-04-21 ENCOUNTER — Other Ambulatory Visit: Payer: Self-pay | Admitting: *Deleted

## 2013-04-21 DIAGNOSIS — N183 Chronic kidney disease, stage 3 (moderate): Secondary | ICD-10-CM

## 2013-04-21 DIAGNOSIS — M545 Low back pain: Secondary | ICD-10-CM

## 2013-04-21 DIAGNOSIS — I1310 Hypertensive heart and chronic kidney disease without heart failure, with stage 1 through stage 4 chronic kidney disease, or unspecified chronic kidney disease: Secondary | ICD-10-CM

## 2013-04-21 DIAGNOSIS — F0390 Unspecified dementia without behavioral disturbance: Secondary | ICD-10-CM

## 2013-04-21 DIAGNOSIS — L8995 Pressure ulcer of unspecified site, unstageable: Secondary | ICD-10-CM

## 2013-04-21 MED ORDER — HYDROCODONE-ACETAMINOPHEN 5-325 MG PO TABS: ORAL_TABLET | ORAL | Status: DC

## 2013-05-19 ENCOUNTER — Other Ambulatory Visit: Payer: Self-pay | Admitting: Geriatric Medicine

## 2013-05-19 MED ORDER — HYDROCODONE-ACETAMINOPHEN 5-325 MG PO TABS: ORAL_TABLET | ORAL | Status: DC

## 2013-05-27 ENCOUNTER — Encounter: Payer: Self-pay | Admitting: *Deleted

## 2013-06-10 ENCOUNTER — Encounter: Payer: Self-pay | Admitting: Internal Medicine

## 2013-06-10 ENCOUNTER — Non-Acute Institutional Stay (SKILLED_NURSING_FACILITY): Payer: PRIVATE HEALTH INSURANCE | Admitting: Internal Medicine

## 2013-06-10 DIAGNOSIS — F0280 Dementia in other diseases classified elsewhere without behavioral disturbance: Secondary | ICD-10-CM | POA: Insufficient documentation

## 2013-06-10 DIAGNOSIS — F29 Unspecified psychosis not due to a substance or known physiological condition: Secondary | ICD-10-CM | POA: Insufficient documentation

## 2013-06-10 DIAGNOSIS — M810 Age-related osteoporosis without current pathological fracture: Secondary | ICD-10-CM | POA: Insufficient documentation

## 2013-06-10 DIAGNOSIS — J309 Allergic rhinitis, unspecified: Secondary | ICD-10-CM | POA: Insufficient documentation

## 2013-06-10 DIAGNOSIS — M159 Polyosteoarthritis, unspecified: Secondary | ICD-10-CM | POA: Insufficient documentation

## 2013-06-10 DIAGNOSIS — H04129 Dry eye syndrome of unspecified lacrimal gland: Secondary | ICD-10-CM | POA: Insufficient documentation

## 2013-06-10 DIAGNOSIS — G309 Alzheimer's disease, unspecified: Secondary | ICD-10-CM

## 2013-06-10 DIAGNOSIS — D518 Other vitamin B12 deficiency anemias: Secondary | ICD-10-CM | POA: Insufficient documentation

## 2013-06-10 DIAGNOSIS — E785 Hyperlipidemia, unspecified: Secondary | ICD-10-CM | POA: Insufficient documentation

## 2013-06-10 DIAGNOSIS — I10 Essential (primary) hypertension: Secondary | ICD-10-CM | POA: Insufficient documentation

## 2013-06-10 DIAGNOSIS — D509 Iron deficiency anemia, unspecified: Secondary | ICD-10-CM | POA: Insufficient documentation

## 2013-06-10 NOTE — Assessment & Plan Note (Signed)
Avoid NSAIDS, monitor clinically

## 2013-06-10 NOTE — Assessment & Plan Note (Signed)
Continue lipitor and monitor flp

## 2013-06-10 NOTE — Assessment & Plan Note (Signed)
Continue seroquel, calm this visit

## 2013-06-10 NOTE — Assessment & Plan Note (Signed)
Persists and slow decline. Change namenda 10 bid to namenda xr 28 mg daily for now. Monitor clinically

## 2013-06-10 NOTE — Assessment & Plan Note (Signed)
Continue systane

## 2013-06-10 NOTE — Progress Notes (Signed)
Patient ID: Janice Turner, female   DOB: 07-02-1920, 79 y.o.   MRN: [id number]  [facility]  Code Status: DNR  Allergies  Allergen Reactions  . Aricept (Donepezil Hcl)   . Naproxen   . Sulfonamide Derivatives     REACTION: rash    Chief Complaint  Patient presents with  . Medical Managment of Chronic Issues    HPI:  79 y/o female patient is a long term resident here. Her bp remains stable. Weight has slowly declined. Has baseline confusion. Difficult to obtain ROS from patient She is followed by wound care for pressure ulcer in right buttock, recent adjustment made to her pain medication and tolerating it well. Her seroquel has been changed to bedtime.  No new concerns from patient or nursing No fever or chills reported has occassional constipation  Past Medical History  Diagnosis Date  . Other vitamin B12 deficiency anemia   . Alzheimer's disease   . Other and unspecified hyperlipidemia   . Tear film insufficiency, unspecified   . Allergic rhinitis, cause unspecified   . Unspecified hypertensive heart and kidney disease without heart failure and with chronic kidney disease stage I through stage IV, or unspecified(404.90)   . Senile osteoporosis   . Iron deficiency anemia, unspecified   . Peripheral vascular disease, unspecified   . Chronic kidney disease, stage III (moderate)   . Generalized osteoarthrosis, involving multiple sites   . Unspecified psychosis   . HTN (hypertension)   . Hyperlipidemia   . CKD (chronic kidney disease) stage 3, GFR 30-59 ml/min     Medications: Patient's Medications  New Prescriptions   No medications on file  Previous Medications   CALCIUM-VITAMIN D (OS-CAL 500 + D) 500-200 MG-UNIT PER TABLET    Take 1 tablet by mouth 2 (two) times daily.   CHOLECALCIFEROL 2000 UNITS CAPS    Take 1 capsule by mouth daily.   DOCUSATE SODIUM (COLACE) 100 MG CAPSULE    Take 100 mg by mouth daily.   FERROUS SULFATE 220 (44 FE)  MG/5ML SOLUTION    Take 220 mg by mouth daily.   FLUTICASONE (FLONASE) 50 MCG/ACT NASAL SPRAY    Place 2 sprays into the nose daily.   LORATADINE (CLARITIN) 10 MG TABLET    Take 10 mg by mouth daily.   MEMANTINE (NAMENDA) 10 MG TABLET    Take 10 mg by mouth 2 (two) times daily.   METOPROLOL TARTRATE (LOPRESSOR) 25 MG TABLET    Take 25 mg by mouth 2 (two) times daily. One fourth a tablet 6.25 mg bid   OMEPRAZOLE (PRILOSEC) 20 MG CAPSULE    Take 20 mg by mouth daily.   POLYETHYL GLYCOL-PROPYL GLYCOL (SYSTANE) 0.4-0.3 % SOLN    Apply 1 drop to eye 4 (four) times daily.   QUETIAPINE (SEROQUEL) 25 MG TABLET    Take 25 mg by mouth at bedtime.   SIMVASTATIN (ZOCOR) 5 MG TABLET    Take 5 mg by mouth at bedtime.  Modified Medications   Modified Medication Previous Medication   HYDROCODONE-ACETAMINOPHEN (NORCO/VICODIN) 5-325 MG PER TABLET HYDROcodone-acetaminophen (NORCO/VICODIN) 5-325 MG per tablet      Take 1 tablet by mouth every 8 (eight) hours. Take one tablet by mouth every 12 hours for pain. Hold for sedation or RR<12/min    Take one tablet by mouth every 12 hours for pain. Hold for sedation or RR<12/min  Discontinued Medications   ACETAMINOPHEN (TYLENOL) 325 MG TABLET    Take  650 mg by mouth every 6 (six) hours as needed for pain.   METOPROLOL (LOPRESSOR) 50 MG TABLET    Take 50 mg by mouth 2 (two) times daily.     Filed Vitals:   06/10/13 0933  BP: 112/62  Pulse: 67  Temp: 97.9 F (36.6 C)  Resp: 18  Weight: 125 lb (56.7 kg)  SpO2: 96%   Physical Exam  Constitutional: No distress.  Alert, pleasantly confused  HENT:  Head: Normocephalic and atraumatic.  Mouth/Throat: Oropharynx is clear and moist.  Eyes: Conjunctivae and EOM are normal. Pupils are equal, round, and reactive to light.  Neck: Normal range of motion. Neck supple.  Cardiovascular: Normal rate, regular rhythm and normal heart sounds.   No murmur heard. Diminished pedal pulses  Pulmonary/Chest: Effort normal and  breath sounds normal. No respiratory distress. She has no wheezes.  Abdominal: Soft. Bowel sounds are normal. She exhibits no distension and no mass.  Musculoskeletal: Normal range of motion. She exhibits no edema and no tenderness.  Kyphosis present, self propels in wheelchair  Neurological: She is alert.  Skin: Skin is warm and dry. She is not diaphoretic.  Shear in right buttock, scab in left great toe  Psychiatric: She has a normal mood and affect. Her behavior is normal.  Pleasantly confused, mmse 13/30 on 08/31/12     Labs reviewed: See chart for full detail  Assessment/Plan Allergic rhinitis, cause unspecified Continue claritin and flonase  Alzheimer's disease Persists and slow decline. Change namenda 10 bid to namenda xr 28 mg daily for now. Monitor clinically  Generalized osteoarthrosis, involving multiple sites Continue current pain regimen. Recent medication adjustment has been helpful.   Unspecified psychosis Continue seroquel, calm this visit  Tear film insufficiency, unspecified Continue systane  HTN (hypertension) Stable at present. Will change toprol 6.25 mg bid to coreg 3.125 mg bid for now. Monitor bp readings and adjust further if needed  Hyperlipidemia Continue lipitor and monitor flp  CKD (chronic kidney disease) stage 3, GFR 30-59 ml/min Avoid NSAIDS, monitor clinically

## 2013-06-10 NOTE — Assessment & Plan Note (Signed)
Continue claritin and flonase. 

## 2013-06-10 NOTE — Assessment & Plan Note (Deleted)
bp remains stable. Will change metoprolol 6.25 mg bid

## 2013-06-10 NOTE — Assessment & Plan Note (Signed)
Continue current pain regimen. Recent medication adjustment has been helpful.

## 2013-06-10 NOTE — Assessment & Plan Note (Signed)
Stable at present. Will change toprol 6.25 mg bid to coreg 3.125 mg bid for now. Monitor bp readings and adjust further if needed

## 2013-06-17 ENCOUNTER — Encounter: Payer: Self-pay | Admitting: *Deleted

## 2013-07-13 ENCOUNTER — Other Ambulatory Visit: Payer: Self-pay | Admitting: *Deleted

## 2013-07-13 MED ORDER — HYDROCODONE-ACETAMINOPHEN 5-325 MG PO TABS: ORAL_TABLET | ORAL | Status: DC

## 2013-07-28 ENCOUNTER — Encounter: Payer: Self-pay | Admitting: Internal Medicine

## 2013-07-28 ENCOUNTER — Non-Acute Institutional Stay (SKILLED_NURSING_FACILITY): Payer: PRIVATE HEALTH INSURANCE | Admitting: Internal Medicine

## 2013-07-28 DIAGNOSIS — J309 Allergic rhinitis, unspecified: Secondary | ICD-10-CM

## 2013-07-28 DIAGNOSIS — K5900 Constipation, unspecified: Secondary | ICD-10-CM

## 2013-07-28 DIAGNOSIS — K219 Gastro-esophageal reflux disease without esophagitis: Secondary | ICD-10-CM

## 2013-07-28 DIAGNOSIS — F0280 Dementia in other diseases classified elsewhere without behavioral disturbance: Secondary | ICD-10-CM

## 2013-07-28 DIAGNOSIS — D509 Iron deficiency anemia, unspecified: Secondary | ICD-10-CM

## 2013-07-28 DIAGNOSIS — M159 Polyosteoarthritis, unspecified: Secondary | ICD-10-CM

## 2013-07-28 DIAGNOSIS — I10 Essential (primary) hypertension: Secondary | ICD-10-CM

## 2013-07-28 DIAGNOSIS — M810 Age-related osteoporosis without current pathological fracture: Secondary | ICD-10-CM

## 2013-07-28 DIAGNOSIS — L8990 Pressure ulcer of unspecified site, unspecified stage: Secondary | ICD-10-CM

## 2013-07-28 DIAGNOSIS — E785 Hyperlipidemia, unspecified: Secondary | ICD-10-CM

## 2013-07-28 NOTE — Progress Notes (Signed)
Patient ID: Janice Turner, female   DOB: 1919/11/24, 79 y.o.   MRN: [id number]  [facility]  Code Status: DNR  HPI:   79 y/o female patient is a long term resident here. She has been having runny nose recently as per nursing staff. Her bp is on lower side of normal mostly. Heart rate well controlled. She is followed by psych services for her psychosis. Currently off all medication for mood. Has baseline confusion in setting of her dementia. Difficult to obtain ROS from patient  ROS Pressure ulcer in her right buttock healed Pain under control with current regimen Patient in no distress this viist No new concerns from staff No fall reported Regular bowel movement with bowel regimen Eating 25-505 of her meals  Allergies  Allergen Reactions  . Aricept [Donepezil Hcl]   . Naproxen   . Sulfonamide Derivatives     REACTION: rash   Past Medical History  Diagnosis Date  . Other vitamin B12 deficiency anemia   . Alzheimer's disease   . Other and unspecified hyperlipidemia   . Tear film insufficiency, unspecified   . Allergic rhinitis, cause unspecified   . Unspecified hypertensive heart and kidney disease without heart failure and with chronic kidney disease stage I through stage IV, or unspecified(404.90)   . Senile osteoporosis   . Iron deficiency anemia, unspecified   . Peripheral vascular disease, unspecified   . Chronic kidney disease, stage III (moderate)   . Generalized osteoarthrosis, involving multiple sites   . Unspecified psychosis   . HTN (hypertension)   . Hyperlipidemia   . CKD (chronic kidney disease) stage 3, GFR 30-59 ml/min   . Unspecified protein-calorie malnutrition    History reviewed. No pertinent past surgical history.  History   Social History  . Marital Status: Divorced    Spouse Name: N/A    Number of Children: N/A  . Years of Education: N/A   Occupational History  . Not on file.   Social History Main Topics  . Smoking  status: Unknown If Ever Smoked  . Smokeless tobacco: Not on file  . Alcohol Use: Not on file  . Drug Use: Not on file  . Sexual Activity: Not Currently   Other Topics Concern  . Not on file   Social History Narrative  . No narrative on file   Current Outpatient Prescriptions on File Prior to Visit  Medication Sig Dispense Refill  . calcium-vitamin D (OS-CAL 500 + D) 500-200 MG-UNIT per tablet Take 1 tablet by mouth 2 (two) times daily.      . Cholecalciferol 2000 UNITS CAPS Take 1 capsule by mouth daily.      Marland Kitchen docusate sodium (COLACE) 100 MG capsule Take 100 mg by mouth daily.      . ferrous sulfate 220 (44 FE) MG/5ML solution Take 220 mg by mouth 2 (two) times daily.       . fluticasone (FLONASE) 50 MCG/ACT nasal spray Place 2 sprays into the nose daily.      Marland Kitchen HYDROcodone-acetaminophen (NORCO/VICODIN) 5-325 MG per tablet Take one tablet by mouth three times daily; Take one tablet by mouth every 6 hours as needed for pain  210 tablet  0  . loratadine (CLARITIN) 10 MG tablet Take 10 mg by mouth daily.      . memantine (NAMENDA) 10 MG tablet Take 10 mg by mouth 2 (two) times daily.      Marland Kitchen omeprazole (PRILOSEC) 20 MG capsule Take 20 mg by  mouth daily.      Bertram Gala Glycol-Propyl Glycol (SYSTANE) 0.4-0.3 % SOLN Apply 1 drop to eye 4 (four) times daily.      . simvastatin (ZOCOR) 5 MG tablet Take 5 mg by mouth at bedtime.       No current facility-administered medications on file prior to visit.     Physical Exam  Constitutional: No distress.  Alert, pleasantly confused  HENT:   Head: Normocephalic and atraumatic.   Mouth/Throat: Oropharynx is clear and moist.  Eyes: Conjunctivae and EOM are normal. Pupils are equal, round, and reactive to light.  Neck: Normal range of motion. Neck supple.  Cardiovascular: Normal rate, regular rhythm and normal heart sounds.    No murmur heard. Diminished pedal pulses  Pulmonary/Chest: Effort normal and breath sounds normal. No respiratory  distress. She has no wheezes.  Abdominal: Soft. Bowel sounds are normal. She exhibits no distension and no mass.  Musculoskeletal: Normal range of motion. She exhibits no edema and no tenderness.  Kyphosis present, self propels in wheelchair  Neurological: She is alert.  Skin: Skin is warm and dry. She is not diaphoretic.  scab in left great toe  Psychiatric: She has a normal mood and affect. Her behavior is normal.  Pleasantly confused, mmse 13/30 on 08/31/12    Labs reviewed: 06/07/13 wbc 8.1, hb 10.8, hct 36.6, plt 316, na 148, k 4.4, bun 50, cr 2.0, glu 106, ca 9.2, lft wnl  Assessment/Plan  Alzheimer's disease Persists and slow decline. continue namenda 10 bid and monitor clinically. Mood has remained stable and she is now off seroquel  Generalized osteoarthrosis, involving multiple sites Continue current pain regimen of norco standing and prn, has been helpful. Also continue bowel regimen. Continue oscal and vit d  HTN bp mostly on lower side of normal or low, will discontinue coreg for now given her age and bp readings. Goal bp < 140/90  Constipation Continue miralax and colace, monitor  Iron deficiency anemia Continue iron supplement  Allergic rhinitis, cause unspecified Continue claritin and flonase  GERD Symptoms controlled with prilosec, monitor  Hyperlipidemia Continue lipitor and monitor flp  CKD (chronic kidney disease) stage 3, GFR 30-59 ml/min Avoid NSAIDS, monitor clinically  Pressure ulcer Healed now. Pressure ulcer precautions. On zince and vitamin c. Will d/c them

## 2013-09-15 ENCOUNTER — Non-Acute Institutional Stay (SKILLED_NURSING_FACILITY): Payer: PRIVATE HEALTH INSURANCE | Admitting: Internal Medicine

## 2013-09-15 DIAGNOSIS — M159 Polyosteoarthritis, unspecified: Secondary | ICD-10-CM

## 2013-09-15 DIAGNOSIS — F0280 Dementia in other diseases classified elsewhere without behavioral disturbance: Secondary | ICD-10-CM

## 2013-09-15 DIAGNOSIS — I10 Essential (primary) hypertension: Secondary | ICD-10-CM

## 2013-09-15 NOTE — Progress Notes (Signed)
Patient ID: Janice Turner, female   DOB: 1919-11-22, 79 y.o.   MRN: [id number]  Allergies  Allergen Reactions  . Aricept [Donepezil Hcl]   . Naproxen   . Sulfonamide Derivatives     REACTION: rash    [facility]  Code Status: DNR  HPI:   79 y/o female patient is a long term resident here and seen today for routine visit. Weight has been stable at present. No new concerns from patient or staff. She is followed by psych services for her psychosis. She has baseline confusion in setting of her dementia. Difficult to obtain ROS from patient. Her pressure ulcers have healed. No new skin concerns, no falls reported, no new behavioral concerns. Is able to eat 25-50% of her meals  ROS Unable to obtain due to her dementia  Past Medical History  Diagnosis Date  . Other vitamin B12 deficiency anemia   . Alzheimer's disease   . Other and unspecified hyperlipidemia   . Tear film insufficiency, unspecified   . Allergic rhinitis, cause unspecified   . Unspecified hypertensive heart and kidney disease without heart failure and with chronic kidney disease stage I through stage IV, or unspecified(404.90)   . Senile osteoporosis   . Iron deficiency anemia, unspecified   . Peripheral vascular disease, unspecified   . Chronic kidney disease, stage III (moderate)   . Generalized osteoarthrosis, involving multiple sites   . Unspecified psychosis   . HTN (hypertension)   . Hyperlipidemia   . CKD (chronic kidney disease) stage 3, GFR 30-59 ml/min   . Unspecified protein-calorie malnutrition    No past surgical history on file.  Medication reviewed. See [facility]   Physical Exam   Vital signs reviewed, nursing note reviewed  Constitutional: No distress.  Alert, pleasantly confused   HENT:   Head: Normocephalic and atraumatic.   Mouth/Throat: Oropharynx is clear and moist.   Eyes: Conjunctivae and EOM are normal. Pupils are equal, round, and reactive to light.   Neck: Normal  range of motion. Neck supple.   Cardiovascular: Normal rate, regular rhythm and normal heart sounds.    No murmur heard. Diminished pedal pulses   Pulmonary/Chest: Effort normal and breath sounds normal. No respiratory distress. She has no wheezes.  Abdominal: Soft. Bowel sounds are normal. She exhibits no distension and no mass.  Musculoskeletal: Normal range of motion. She exhibits no edema and no tenderness.  Kyphosis present, self propels in wheelchair  Neurological: She is alert.   Skin: Skin is warm and dry. She is not diaphoretic.  scab in left great toe  Psychiatric: She has a normal mood and affect. Her behavior is normal.  Pleasantly confused, mmse 13/30 on 08/31/12    Labs reviewed: 06/07/13 wbc 8.1, hb 10.8, hct 36.6, plt 316, na 148, k 4.4, bun 50, cr 2.0, glu 106, ca 9.2, lft wnl 08/04/13 tsh 2.495, wbc 7.2, hb 10.7, hct 36, plt 290  Assessment/Plan  Alzheimer's disease Persists and slow decline. continue namenda 10 bid and monitor clinically. Mood has remained stable   Iron deficiency anemia Continue iron supplement, monitor h/h  GERD Symptoms currently controlled with prilosec, monitor  Generalized osteoarthrosis, involving multiple sites Continue current pain regimen of norco standing and prn, has been helpful. Also continue bowel regimen. Continue oscal and vit d  HTN Currently off all medication and bp under control  Constipation Continue miralax and colace, monitor  Allergic rhinitis, cause unspecified Continue claritin and flonase  Hyperlipidemia Continue lipitor and  monitor flp  CKD (chronic kidney disease) stage 3, GFR 30-59 ml/min Avoid NSAIDS, monitor clinically

## 2013-09-23 ENCOUNTER — Other Ambulatory Visit: Payer: Self-pay | Admitting: *Deleted

## 2013-09-23 MED ORDER — HYDROCODONE-ACETAMINOPHEN 5-325 MG PO TABS: ORAL_TABLET | ORAL | Status: DC

## 2013-09-28 ENCOUNTER — Other Ambulatory Visit: Payer: Self-pay | Admitting: *Deleted

## 2013-09-28 MED ORDER — HYDROCODONE-ACETAMINOPHEN 5-325 MG PO TABS: ORAL_TABLET | ORAL | Status: DC

## 2013-10-26 ENCOUNTER — Other Ambulatory Visit: Payer: Self-pay | Admitting: *Deleted

## 2013-10-26 MED ORDER — HYDROCODONE-ACETAMINOPHEN 5-325 MG PO TABS: ORAL_TABLET | ORAL | Status: DC

## 2013-10-27 ENCOUNTER — Non-Acute Institutional Stay (SKILLED_NURSING_FACILITY): Payer: PRIVATE HEALTH INSURANCE | Admitting: Internal Medicine

## 2013-10-27 ENCOUNTER — Encounter: Payer: Self-pay | Admitting: Internal Medicine

## 2013-10-27 DIAGNOSIS — I10 Essential (primary) hypertension: Secondary | ICD-10-CM

## 2013-10-27 DIAGNOSIS — J309 Allergic rhinitis, unspecified: Secondary | ICD-10-CM

## 2013-10-27 DIAGNOSIS — D509 Iron deficiency anemia, unspecified: Secondary | ICD-10-CM

## 2013-10-27 DIAGNOSIS — F0280 Dementia in other diseases classified elsewhere without behavioral disturbance: Secondary | ICD-10-CM

## 2013-10-27 DIAGNOSIS — N183 Chronic kidney disease, stage 3 unspecified: Secondary | ICD-10-CM

## 2013-10-27 DIAGNOSIS — E785 Hyperlipidemia, unspecified: Secondary | ICD-10-CM | POA: Insufficient documentation

## 2013-10-27 NOTE — Progress Notes (Signed)
Patient ID: Bert Givans, female   DOB: 13-May-1920, 79 y.o.   MRN: [id number]  [facility]  Code Status: DNR  HPI:   79 y/o female patient is a long term resident here. She is seen in her room. No new concerns from patient or staff. No falls reported. Heart rate well controlled. She is followed by psych services for her psychosis. Currently off all medication for mood. Has baseline confusion in setting of her dementia. Difficult to obtain ROS from patient. Pain is under control  ROS No new concerns from staff No fall reported Regular bowel movement with bowel regimen Eating 25-50 percent of her meals  Past Medical History  Diagnosis Date  . Other vitamin B12 deficiency anemia   . Alzheimer's disease   . Other and unspecified hyperlipidemia   . Tear film insufficiency, unspecified   . Allergic rhinitis, cause unspecified   . Unspecified hypertensive heart and kidney disease without heart failure and with chronic kidney disease stage I through stage IV, or unspecified(404.90)   . Senile osteoporosis   . Iron deficiency anemia, unspecified   . Peripheral vascular disease, unspecified   . Chronic kidney disease, stage III (moderate)   . Generalized osteoarthrosis, involving multiple sites   . Unspecified psychosis   . HTN (hypertension)   . Hyperlipidemia   . CKD (chronic kidney disease) stage 3, GFR 30-59 ml/min   . Unspecified protein-calorie malnutrition     Medication reviewed. See [facility]  Physical Exam  BP 114/76  Pulse 70  Temp(Src) 98.3 F (36.8 C)  Resp 16  SpO2 96%  Constitutional: No distress.  Alert, pleasantly confused   HENT:   Head: Normocephalic and atraumatic.   Mouth/Throat: Oropharynx is clear and moist.   Eyes: Conjunctivae and EOM are normal. Pupils are equal, round, and reactive to light.   Neck: Normal range of motion. Neck supple.   Cardiovascular: Normal rate, regular rhythm and normal heart sounds.    No murmur  heard. Diminished pedal pulses   Pulmonary/Chest: Effort normal and breath sounds normal. No respiratory distress. She has no wheezes.   Abdominal: Soft. Bowel sounds are normal. She exhibits no distension and no mass.  Musculoskeletal: Normal range of motion. She exhibits no edema and no tenderness.  Kyphosis present, self propels in wheelchair  Neurological: She is alert.   Skin: Skin is warm and dry. She is not diaphoretic.  Psychiatric: She has a normal mood and affect. Her behavior is normal.  Pleasantly confused, mmse 13/30 on 08/31/12    Labs reviewed: 06/07/13 wbc 8.1, hb 10.8, hct 36.6, plt 316, na 148, k 4.4, bun 50, cr 2.0, glu 106, ca 9.2, lft wnl  Assessment/Plan  Alzheimer's disease Persists and slow decline. continue namenda xr and monitor clinically.   Generalized osteoarthrosis, involving multiple sites Continue current pain regimen of norco and Continue oscal and vit d  Constipation Continue miralax and colace, monitor  HTN Off all medication, bp well controlled. Monitor  CKD (chronic kidney disease) stage 3, GFR 30-59 ml/min Avoid NSAIDS, monitor clinically  Iron deficiency anemia Continue iron supplement and will monitor h/h  Allergic rhinitis, cause unspecified Continue claritin and flonase  Hyperlipidemia Continue lipitor and monitor flp

## 2013-11-28 ENCOUNTER — Other Ambulatory Visit: Payer: Self-pay | Admitting: *Deleted

## 2013-11-28 MED ORDER — HYDROCODONE-ACETAMINOPHEN 5-325 MG PO TABS: ORAL_TABLET | ORAL | Status: DC

## 2013-12-12 ENCOUNTER — Other Ambulatory Visit: Payer: Self-pay | Admitting: *Deleted

## 2013-12-12 MED ORDER — LORAZEPAM 1 MG PO TABS: ORAL_TABLET | ORAL | Status: DC

## 2013-12-16 ENCOUNTER — Non-Acute Institutional Stay (SKILLED_NURSING_FACILITY): Payer: PRIVATE HEALTH INSURANCE | Admitting: Internal Medicine

## 2013-12-16 DIAGNOSIS — E785 Hyperlipidemia, unspecified: Secondary | ICD-10-CM

## 2013-12-16 DIAGNOSIS — I10 Essential (primary) hypertension: Secondary | ICD-10-CM

## 2013-12-16 DIAGNOSIS — M1990 Unspecified osteoarthritis, unspecified site: Secondary | ICD-10-CM

## 2013-12-16 DIAGNOSIS — G309 Alzheimer's disease, unspecified: Secondary | ICD-10-CM

## 2013-12-16 DIAGNOSIS — F0280 Dementia in other diseases classified elsewhere without behavioral disturbance: Secondary | ICD-10-CM

## 2013-12-16 DIAGNOSIS — K219 Gastro-esophageal reflux disease without esophagitis: Secondary | ICD-10-CM

## 2013-12-16 DIAGNOSIS — J309 Allergic rhinitis, unspecified: Secondary | ICD-10-CM

## 2013-12-16 NOTE — Progress Notes (Signed)
Patient ID: Janice RudMargaret Turner, female   DOB: Dec 24, 1919, 80 y.o.   MRN: [id number]     [facility]  Code Status: DNR  HPI:   80 y/o female patient is a long term resident here. She is seen in her room. No new concerns from patient or staff. No falls reported. Heart rate well controlled. She is followed by psych services for her psychosis. Currently off all medication for mood.   ROS No new concerns from staff No fall reported Regular bowel movement with bowel regimen Eating 25-50 percent of her meals Unable to obtain detailed ROS from pt  Past Medical History  Diagnosis Date  . Other vitamin B12 deficiency anemia   . Alzheimer's disease   . Other and unspecified hyperlipidemia   . Tear film insufficiency, unspecified   . Allergic rhinitis, cause unspecified   . Unspecified hypertensive heart and kidney disease without heart failure and with chronic kidney disease stage I through stage IV, or unspecified(404.90)   . Senile osteoporosis   . Iron deficiency anemia, unspecified   . Peripheral vascular disease, unspecified   . Chronic kidney disease, stage III (moderate)   . Generalized osteoarthrosis, involving multiple sites   . Unspecified psychosis   . HTN (hypertension)   . Hyperlipidemia   . CKD (chronic kidney disease) stage 3, GFR 30-59 ml/min   . Unspecified protein-calorie malnutrition    [facility]  Physical exam BP 114/78  Pulse 63  Temp(Src) 96.9 F (36.1 C)  Resp 16  SpO2 96%  Constitutional: No distress.  Alert, pleasantly confused   HENT:   Head: Normocephalic and atraumatic.   Mouth/Throat: Oropharynx is clear and moist.   Eyes: Conjunctivae and EOM are normal. Pupils are equal, round, and reactive to light.   Neck: Normal range of motion. Neck supple.   Cardiovascular: Normal rate, regular rhythm and normal heart sounds.    No murmur heard. Diminished pedal pulses   Pulmonary/Chest: Effort normal and breath  sounds normal. No respiratory distress. She has no wheezes.   Abdominal: Soft. Bowel sounds are normal. She exhibits no distension and no mass.  Musculoskeletal: Normal range of motion. She exhibits no edema and no tenderness.  Kyphosis present, self propels in wheelchair  Neurological: She is alert.   Skin: Skin is warm and dry. She is not diaphoretic.  scab in left great toe  Psychiatric: She has a normal mood and affect. Her behavior is normal.  Pleasantly confused, mmse 13/30 on 08/31/12    Labs reviewed: 06/07/13 wbc 8.1, hb 10.8, hct 36.6, plt 316, na 148, k 4.4, bun 50, cr 2.0, glu 106, ca 9.2, lft wnl  Assessment/Plan  Alzheimer's disease Persists and slow decline. continue namenda xr 28 mg daily and monitor clinically.   GERD Symptoms controlled with prilosec, decrease to 10 mg daily  Allergic rhinitis, cause unspecified Continue claritin and flonase  Hyperlipidemia Continue lipitor and monitor flp  Generalized osteoarthrosis, involving multiple sites Continue current pain regimen of norco standing and prn, has been helpful. Also continue bowel regimen. Continue oscal and vit d.fa ll precautions  HTN Well controlled, off all meds

## 2013-12-26 ENCOUNTER — Other Ambulatory Visit: Payer: Self-pay | Admitting: *Deleted

## 2013-12-26 MED ORDER — HYDROCODONE-ACETAMINOPHEN 5-325 MG PO TABS: ORAL_TABLET | ORAL | Status: DC

## 2013-12-26 NOTE — Telephone Encounter (Signed)
[facility] 

## 2014-01-09 ENCOUNTER — Encounter: Payer: Self-pay | Admitting: Internal Medicine

## 2014-01-09 ENCOUNTER — Non-Acute Institutional Stay (SKILLED_NURSING_FACILITY): Payer: PRIVATE HEALTH INSURANCE | Admitting: Internal Medicine

## 2014-01-09 DIAGNOSIS — D518 Other vitamin B12 deficiency anemias: Secondary | ICD-10-CM

## 2014-01-09 DIAGNOSIS — E46 Unspecified protein-calorie malnutrition: Secondary | ICD-10-CM

## 2014-01-09 DIAGNOSIS — F0280 Dementia in other diseases classified elsewhere without behavioral disturbance: Secondary | ICD-10-CM

## 2014-01-09 DIAGNOSIS — G309 Alzheimer's disease, unspecified: Secondary | ICD-10-CM

## 2014-01-09 DIAGNOSIS — M159 Polyosteoarthritis, unspecified: Secondary | ICD-10-CM

## 2014-01-09 NOTE — Progress Notes (Signed)
Patient ID: Janice Turner, female   DOB: [birth year], 80 y.o.   MRN: [id number]    [facility]  Code Status: DNR  HPI:   80 y/o female patient is a long term resident here. She is seen in her room. No new concerns from patient or staff. No falls reported. Has dementia. She is followed by psych services for her psychosis. Difficult to obtain ROS from patient.   ROS No new concerns from staff No fall reported Eating 25-50 percent of her meals  Past Medical History  Diagnosis Date  . Other vitamin B12 deficiency anemia   . Alzheimer's disease   . Other and unspecified hyperlipidemia   . Tear film insufficiency, unspecified   . Allergic rhinitis, cause unspecified   . Unspecified hypertensive heart and kidney disease without heart failure and with chronic kidney disease stage I through stage IV, or unspecified   . Senile osteoporosis   . Iron deficiency anemia, unspecified   . Peripheral vascular disease, unspecified   . Chronic kidney disease, stage III (moderate)   . Generalized osteoarthrosis, involving multiple sites   . Unspecified psychosis   . HTN (hypertension)   . Hyperlipidemia   . CKD (chronic kidney disease) stage 3, GFR 30-59 ml/min   . Unspecified protein-calorie malnutrition    Medication [facility]. [facility]  Physical exam BP 118/68  Pulse 72  Temp(Src) 97 F (36.1 C)  Resp 18  SpO2 96%  Constitutional: No distress.  Alert, pleasantly confused   HENT:   Head: Normocephalic and atraumatic.   Mouth/Throat: Oropharynx is clear and moist.   Eyes: Conjunctivae and EOM are normal. Pupils are equal, round, and reactive to light.   Neck: Normal range of motion. Neck supple.   Cardiovascular: Normal rate, regular rhythm and normal heart sounds.    No murmur heard. Diminished pedal pulses   Pulmonary/Chest: Effort normal and breath sounds normal. No respiratory distress. She has no wheezes.   Abdominal: Soft. Bowel sounds are normal. She  exhibits no distension and no mass.  Musculoskeletal: Normal range of motion. She exhibits no edema and no tenderness.  Kyphosis present, self propels in wheelchair  Neurological: She is alert.   Skin: Skin is warm and dry. She is not diaphoretic.  Psychiatric: She has a normal mood and affect. Her behavior is normal.   Labs reviewed: 06/07/13 wbc 8.1, hb 10.8, hct 36.6, plt 316, na 148, k 4.4, bun 50, cr 2.0, glu 106, ca 9.2, lft wnl 10/07/13 wbc 5.6, hb 10.1, hct 33.3, plt 253, na 138, k 4.8, bun 38, cr 1, glu 83, ca 9  Assessment/Plan  protein calorie malnutrition With advanced dementia, weight has been low, continue medpass and encourage po intake  b12 deficiency Continue cyanocobalamin  Alzheimer's disease Persists and slow decline. continue namenda xr for now  Generalized osteoarthrosis, involving multiple sites Continue current pain regimen of norco standing and prn, has been helpful. Also continue bowel regimen. Continue oscal and vit d

## 2014-01-24 ENCOUNTER — Other Ambulatory Visit: Payer: Self-pay | Admitting: *Deleted

## 2014-01-24 MED ORDER — HYDROCODONE-ACETAMINOPHEN 5-325 MG PO TABS: ORAL_TABLET | ORAL | Status: DC

## 2014-01-24 NOTE — Telephone Encounter (Signed)
[facility] 

## 2014-01-25 DIAGNOSIS — E46 Unspecified protein-calorie malnutrition: Secondary | ICD-10-CM | POA: Insufficient documentation

## 2014-02-06 ENCOUNTER — Non-Acute Institutional Stay (SKILLED_NURSING_FACILITY): Payer: PRIVATE HEALTH INSURANCE | Admitting: Internal Medicine

## 2014-02-06 DIAGNOSIS — N183 Chronic kidney disease, stage 3 unspecified: Secondary | ICD-10-CM

## 2014-02-06 DIAGNOSIS — D509 Iron deficiency anemia, unspecified: Secondary | ICD-10-CM

## 2014-02-06 DIAGNOSIS — G309 Alzheimer's disease, unspecified: Secondary | ICD-10-CM

## 2014-02-06 DIAGNOSIS — I1310 Hypertensive heart and chronic kidney disease without heart failure, with stage 1 through stage 4 chronic kidney disease, or unspecified chronic kidney disease: Secondary | ICD-10-CM

## 2014-02-06 DIAGNOSIS — I739 Peripheral vascular disease, unspecified: Secondary | ICD-10-CM

## 2014-02-06 DIAGNOSIS — F0280 Dementia in other diseases classified elsewhere without behavioral disturbance: Secondary | ICD-10-CM

## 2014-02-06 DIAGNOSIS — K219 Gastro-esophageal reflux disease without esophagitis: Secondary | ICD-10-CM

## 2014-02-06 DIAGNOSIS — M1990 Unspecified osteoarthritis, unspecified site: Secondary | ICD-10-CM

## 2014-02-06 NOTE — Progress Notes (Signed)
Patient ID: Janice Turner, female   DOB: 11-28-1919, 80 y.o.   MRN: [id number]     [facility]  Code Status: DNR  HPI:   80 y/o female patient is a long term resident here. She is seen in her room. No new concerns from patient or staff. No falls reported. Has dementia. She is followed by psych services for her psychosis. Difficult to obtain ROS from patient.   ROS No new concerns from staff No fall reported Eating 25-50 percent of her meals    Past Medical History  Diagnosis Date  . Other vitamin B12 deficiency anemia   . Alzheimer's disease   . Other and unspecified hyperlipidemia   . Tear film insufficiency, unspecified   . Allergic rhinitis, cause unspecified   . Unspecified hypertensive heart and kidney disease without heart failure and with chronic kidney disease stage I through stage IV, or unspecified   . Senile osteoporosis   . Iron deficiency anemia, unspecified   . Peripheral vascular disease, unspecified   . Chronic kidney disease, stage III (moderate)   . Generalized osteoarthrosis, involving multiple sites   . Unspecified psychosis   . HTN (hypertension)   . Hyperlipidemia   . CKD (chronic kidney disease) stage 3, GFR 30-59 ml/min   . Unspecified protein-calorie malnutrition    No past surgical history on file.   Current Outpatient Prescriptions on File Prior to Visit  Medication Sig Dispense Refill  . calcium-vitamin D (OS-CAL 500 + D) 500-200 MG-UNIT per tablet Take 1 tablet by mouth 2 (two) times daily.      . Cholecalciferol 2000 UNITS CAPS Take 1 capsule by mouth daily.      Marland Kitchen docusate sodium (COLACE) 100 MG capsule Take 100 mg by mouth daily.      . ferrous sulfate 220 (44 FE) MG/5ML solution Take 220 mg by mouth 2 (two) times daily.       . fluticasone (FLONASE) 50 MCG/ACT nasal spray Place 2 sprays into the nose daily.      Marland Kitchen HYDROcodone-acetaminophen (NORCO/VICODIN) 5-325 MG per tablet Take one tablet by mouth three times daily  for pain  90 tablet  0  . loratadine (CLARITIN) 10 MG tablet Take 10 mg by mouth daily.      Marland Kitchen LORazepam (ATIVAN) 1 MG tablet Take one tablet by mouth 30 minutes prior to dental procedure on 12/15/2013  1 tablet  1  . memantine (NAMENDA) 10 MG tablet Take 10 mg by mouth 2 (two) times daily.      Marland Kitchen omeprazole (PRILOSEC) 20 MG capsule Take 10 mg by mouth daily.       . polyethylene glycol (MIRALAX / GLYCOLAX) packet Take 17 g by mouth daily.      . simvastatin (ZOCOR) 5 MG tablet Take 5 mg by mouth at bedtime.      . vitamin B-12 (CYANOCOBALAMIN) 1000 MCG tablet Take 1,000 mcg by mouth daily.       No current facility-administered medications on file prior to visit.    Physical exam BP 124/79  Pulse 81  Temp(Src) 97.5 F (36.4 C)  Resp 18  SpO2 96%  Constitutional: No distress.  Alert, pleasantly confused   HENT:   Head: Normocephalic and atraumatic.   Mouth/Throat: Oropharynx is clear and moist.   Eyes: Conjunctivae and EOM are normal. Pupils are equal, round, and reactive to light.   Neck: Normal range of motion. Neck supple.   Cardiovascular: Normal rate, regular rhythm  and normal heart sounds.    No murmur heard. Diminished pedal pulses   Pulmonary/Chest: Effort normal and breath sounds normal. No respiratory distress. She has no wheezes.   Abdominal: Soft. Bowel sounds are normal. She exhibits no distension and no mass.  Musculoskeletal: Normal range of motion. She exhibits no edema and no tenderness.  Kyphosis present, self propels in wheelchair  Neurological: She is alert.   Skin: Skin is warm and dry. She is not diaphoretic.  Psychiatric: She has a normal mood and affect. Her behavior is normal.   Labs reviewed: 06/07/13 wbc 8.1, hb 10.8, hct 36.6, plt 316, na 148, k 4.4, bun 50, cr 2.0, glu 106, ca 9.2, lft wnl 10/07/13 wbc 5.6, hb 10.1, hct 33.3, plt 253, na 138, k 4.8, bun 38, cr 1, glu 83, ca 9 01/02/14 wbc 5.6, hb 11.4, na 143, k 4.9, bun 31, cr 0.9, glu 84, ca  9  Assessment/Plan  CKD Monitor renal function. Avoid NSAIDs  PVD No wounds at present, has gel mattress and floaters. Continue barrier cream and repositioning. Monitor for open sores  Iron def anemia Stable h/h. Continue iron supplement  HTN Stable, off all medications at present  protein calorie malnutrition With advanced dementia, weight has been low, continue medpass and encourage po intake  Alzheimer's disease Persists and slow decline. continue namenda xr for now  Generalized osteoarthrosis, involving multiple sites Continue current pain regimen of norco standing and prn, has been helpful. Also continue bowel regimen. Continue oscal and vit d  Hyperlipidemia Continue zocor  GERD Stable on low dose prilosec for now

## 2014-02-24 ENCOUNTER — Other Ambulatory Visit: Payer: Self-pay | Admitting: *Deleted

## 2014-02-24 MED ORDER — HYDROCODONE-ACETAMINOPHEN 5-325 MG PO TABS: ORAL_TABLET | ORAL | Status: DC

## 2014-02-24 NOTE — Telephone Encounter (Signed)
rx faxed to [facility] @ [phone]. 

## 2014-03-16 ENCOUNTER — Non-Acute Institutional Stay (SKILLED_NURSING_FACILITY): Payer: PRIVATE HEALTH INSURANCE | Admitting: Internal Medicine

## 2014-03-16 DIAGNOSIS — E46 Unspecified protein-calorie malnutrition: Secondary | ICD-10-CM

## 2014-03-16 DIAGNOSIS — F0280 Dementia in other diseases classified elsewhere without behavioral disturbance: Secondary | ICD-10-CM

## 2014-03-16 DIAGNOSIS — J309 Allergic rhinitis, unspecified: Secondary | ICD-10-CM

## 2014-03-16 DIAGNOSIS — G309 Alzheimer's disease, unspecified: Secondary | ICD-10-CM

## 2014-03-16 NOTE — Progress Notes (Signed)
Patient ID: Janice Turner, female   DOB: 02-18-1920, 80 y.o.   MRN: [id number]     [facility]  Code Status: DNR  HPI:   80 y/o female patient is a long term resident here. She has been at her baseline with advanced dementia and there are no ongoing concerns.No falls reported. she is followed by psych services for her psychosis. Difficult to obtain ROS from patient. No new skin concerns. Allergy symptom under control. Behavior has been stable. Weight remains stable  ROS Unable to obtain    Past Medical History  Diagnosis Date  . Other vitamin B12 deficiency anemia   . Alzheimer's disease   . Other and unspecified hyperlipidemia   . Tear film insufficiency, unspecified   . Allergic rhinitis, cause unspecified   . Unspecified hypertensive heart and kidney disease without heart failure and with chronic kidney disease stage I through stage IV, or unspecified   . Senile osteoporosis   . Iron deficiency anemia, unspecified   . Peripheral vascular disease, unspecified   . Chronic kidney disease, stage III (moderate)   . Generalized osteoarthrosis, involving multiple sites   . Unspecified psychosis   . HTN (hypertension)   . Hyperlipidemia   . CKD (chronic kidney disease) stage 3, GFR 30-59 ml/min   . Unspecified protein-calorie malnutrition    No past surgical history on file.  [facility]. [facility]  Physical exam BP 117/68  Pulse 68  Temp(Src) 97.7 F (36.5 C)  Resp 18  Wt 120 lb 12.8 oz (54.795 kg)  SpO2 96%  Constitutional: No distress.  Alert, pleasantly confused   HENT:   Head: Normocephalic and atraumatic.   Mouth/Throat: Oropharynx is clear and moist.   Eyes: Conjunctivae and EOM are normal. Pupils are equal, round, and reactive to light.   Neck: Normal range of motion. Neck supple.   Cardiovascular: Normal rate, regular rhythm and normal heart sounds.    No murmur heard. Diminished pedal pulses   Pulmonary/Chest: Effort normal and  breath sounds normal. No respiratory distress. She has no wheezes.   Abdominal: Soft. Bowel sounds are normal. She exhibits no distension and no mass.  Musculoskeletal: Normal range of motion. She exhibits no edema and no tenderness.  Kyphosis present, self propels in wheelchair  Neurological: She is alert.   Skin: Skin is warm and dry. She is not diaphoretic.  Psychiatric: She has a normal mood and affect. Her behavior is normal.   Labs reviewed: 06/07/13 wbc 8.1, hb 10.8, hct 36.6, plt 316, na 148, k 4.4, bun 50, cr 2.0, glu 106, ca 9.2, lft wnl 10/07/13 wbc 5.6, hb 10.1, hct 33.3, plt 253, na 138, k 4.8, bun 38, cr 1, glu 83, ca 9 01/02/14 wbc 5.6, hb 11.4, na 143, k 4.9, bun 31, cr 0.9, glu 84, ca 9  Assessment/Plan  Allergic rhinitis Stable with flonase and claritin. Attempts to discontinue these in past have worsened symptoms. monitor for now  Alzheimer's disease Persists and slow decline anticipated. continue namenda. Comfort care is main goal of care  protein calorie malnutrition With advanced dementia. Weight remains stable. continue medpass and encourage po intake. Monitor weight and provide skin care  GERD Stable on low dose prilosec for now

## 2014-03-28 ENCOUNTER — Other Ambulatory Visit: Payer: Self-pay | Admitting: *Deleted

## 2014-03-28 MED ORDER — HYDROCODONE-ACETAMINOPHEN 5-325 MG PO TABS: ORAL_TABLET | ORAL | Status: DC

## 2014-03-28 NOTE — Telephone Encounter (Signed)
[facility] 

## 2014-04-03 ENCOUNTER — Non-Acute Institutional Stay (SKILLED_NURSING_FACILITY): Payer: PRIVATE HEALTH INSURANCE | Admitting: Internal Medicine

## 2014-04-03 DIAGNOSIS — E785 Hyperlipidemia, unspecified: Secondary | ICD-10-CM

## 2014-04-03 DIAGNOSIS — M159 Polyosteoarthritis, unspecified: Secondary | ICD-10-CM

## 2014-04-03 DIAGNOSIS — F29 Unspecified psychosis not due to a substance or known physiological condition: Secondary | ICD-10-CM

## 2014-04-06 LAB — CBC AND DIFFERENTIAL
HEMATOCRIT: 36 % (ref 36–46)
HEMOGLOBIN: 10.9 g/dL — AB (ref 12.0–16.0)
PLATELETS: 222 10*3/uL (ref 150–399)
WBC: 6.4 10^3/mL

## 2014-04-06 LAB — BASIC METABOLIC PANEL
BUN: 38 mg/dL — AB (ref 4–21)
Glucose: 86 mg/dL
Potassium: 4.7 mmol/L (ref 3.4–5.3)
Sodium: 141 mmol/L (ref 137–147)

## 2014-04-06 LAB — HEPATIC FUNCTION PANEL
ALK PHOS: 72 U/L (ref 25–125)
ALT: 6 U/L — AB (ref 7–35)
AST: 13 U/L (ref 13–35)
Bilirubin, Total: 0.3 mg/dL

## 2014-04-06 LAB — LIPID PANEL
Cholesterol: 178 mg/dL (ref 0–200)
HDL: 34 mg/dL — AB (ref 35–70)
LDL CALC: 100 mg/dL
LDL/HDL RATIO: 5.2
TRIGLYCERIDES: 218 mg/dL — AB (ref 40–160)

## 2014-04-25 ENCOUNTER — Other Ambulatory Visit: Payer: Self-pay | Admitting: *Deleted

## 2014-04-25 MED ORDER — HYDROCODONE-ACETAMINOPHEN 5-325 MG PO TABS
ORAL_TABLET | ORAL | Status: DC
Start: 2014-04-25 — End: 2014-05-26

## 2014-04-25 NOTE — Telephone Encounter (Signed)
[facility] 

## 2014-05-01 ENCOUNTER — Non-Acute Institutional Stay (SKILLED_NURSING_FACILITY): Payer: PRIVATE HEALTH INSURANCE | Admitting: Internal Medicine

## 2014-05-01 DIAGNOSIS — G309 Alzheimer's disease, unspecified: Secondary | ICD-10-CM

## 2014-05-01 DIAGNOSIS — F0280 Dementia in other diseases classified elsewhere without behavioral disturbance: Secondary | ICD-10-CM

## 2014-05-01 DIAGNOSIS — J309 Allergic rhinitis, unspecified: Secondary | ICD-10-CM

## 2014-05-01 DIAGNOSIS — K219 Gastro-esophageal reflux disease without esophagitis: Secondary | ICD-10-CM

## 2014-05-01 NOTE — Progress Notes (Signed)
Patient ID: Janice Turner, female   DOB: 01-Jun-1920, 80 y.o.   MRN: [id number]     [facility]  Chief Complaint  Patient presents with  . Medical Management of Chronic Issues    allergic rhinitis, AD, GERD,    Allergies  Allergen Reactions  . Aricept [Donepezil Hcl]   . Naproxen   . Sulfonamide Derivatives     REACTION: rash   Code Status: DNR  HPI:   80 y/o female patient is a long term resident here. She has been at her baseline with advanced dementia and there are no ongoing concerns.No falls reported. Weight remains stable. Difficult to obtain ROS from patient but she reports that her allergy has been bothering her. Reports nasal congestion and productive cough occasionally. Denies any shortness of breath and chest pain.   ROS Refer to HPI   Past Medical History  Diagnosis Date  . Other vitamin B12 deficiency anemia   . Alzheimer's disease   . Other and unspecified hyperlipidemia   . Tear film insufficiency, unspecified   . Allergic rhinitis, cause unspecified   . Unspecified hypertensive heart and kidney disease without heart failure and with chronic kidney disease stage I through stage IV, or unspecified   . Senile osteoporosis   . Iron deficiency anemia, unspecified   . Peripheral vascular disease, unspecified   . Chronic kidney disease, stage III (moderate)   . Generalized osteoarthrosis, involving multiple sites   . Unspecified psychosis   . HTN (hypertension)   . Hyperlipidemia   . CKD (chronic kidney disease) stage 3, GFR 30-59 ml/min   . Unspecified protein-calorie malnutrition    No past surgical history on file.  Outpatient Encounter Prescriptions as of 05/01/2014  Medication Sig  . calcium-vitamin D (OS-CAL 500 + D) 500-200 MG-UNIT per tablet Take 1 tablet by mouth 2 (two) times daily.  . Cholecalciferol 2000 UNITS CAPS Take 1 capsule by mouth daily.  Marland Kitchen. docusate sodium (COLACE) 100 MG capsule Take 100 mg by mouth daily.  .  ferrous sulfate 220 (44 FE) MG/5ML solution Take 220 mg by mouth 2 (two) times daily.   . fluticasone (FLONASE) 50 MCG/ACT nasal spray Place 2 sprays into the nose daily.  Marland Kitchen. HYDROcodone-acetaminophen (NORCO/VICODIN) 5-325 MG per tablet Take one tablet by mouth three times daily for pain  . loratadine (CLARITIN) 10 MG tablet Take 10 mg by mouth daily.  . Memantine HCl ER (NAMENDA XR) 28 MG CP24 Take 28 mg by mouth daily.  Marland Kitchen. omeprazole (PRILOSEC) 20 MG capsule Take 10 mg by mouth daily.   . polyethylene glycol (MIRALAX / GLYCOLAX) packet Take 17 g by mouth daily.  . simvastatin (ZOCOR) 5 MG tablet Take 5 mg by mouth at bedtime.  . vitamin B-12 (CYANOCOBALAMIN) 1000 MCG tablet Take 1,000 mcg by mouth daily.  Marland Kitchen. LORazepam (ATIVAN) 1 MG tablet Take one tablet by mouth 30 minutes prior to dental procedure on 12/15/2013  . [DISCONTINUED] memantine (NAMENDA) 10 MG tablet Take 10 mg by mouth 2 (two) times daily.    Physical Exam: BP 139/76  Pulse 59  Temp(Src) 96.9 F (36.1 C)  Resp 18  Ht 5\' 5"  (1.651 m)  Wt 123 lb 6.4 oz (55.974 kg)  BMI 20.53 kg/m2  Constitutional: Frail elderly female in no acute distress. Voice sounds congested  Head: Normocephalic and atraumatic.   Mouth/Throat: Oropharynx is clear and moist.  Eyes: Conjunctivae clear. EOM intact. Pupils are equal, round, and reactive to light.  Neck: Normal range of motion. Neck supple. No lymphadenopathy. No JVD. No carotid bruit.  Pulmonary/Chest: No respiratory distress. Clear to auscultation bilaterally.   Cardiovascular: Normal rate and rhythm. No murmurs, rubs, or gallops. Diminished pedal pulses. No edema noted. Abdominal: Soft. Bowel sounds present. No tenderness Musculoskeletal: Normal range of motion. Nontender to palpation  Neurological: She is alert and interactive.  Skin: Skin is warm and dry. She is not diaphoretic.  Psychiatric: She has a normal mood and affect. Her behavior is normal.   Labs reviewed: 06/07/13 wbc  8.1, hb 10.8, hct 36.6, plt 316, na 148, k 4.4, bun 50, cr 2.0, glu 106, ca 9.2, lft wnl 10/07/13 wbc 5.6, hb 10.1, hct 33.3, plt 253, na 138, k 4.8, bun 38, cr 1, glu 83, ca 9 01/02/14 wbc 5.6, hb 11.4, na 143, k 4.9, bun 31, cr 0.9, glu 84, ca 9 04/06/14 wbc 6.4 hb 10.9, mcv 98.9, plt 222, na 141, k 4.7, cl 108, glu 86, bun 38, cr 1.1  Assessment/Plan:  1. Allergic rhinitis Continue claritin and flonase. Monitor for now.   2. Alzheimer's disease Ongoing. Slow decline anticipated. Continue Namenda XR 28mg  PO QD.   3. GERD (gastroesophageal reflux disease) Stable on Prilosec. Reassess. May discontinue if she doesn't have any symptoms.  Plan of care discuss with nursing staff. Staff verbalize understanding and agree with plan of care

## 2014-05-26 ENCOUNTER — Other Ambulatory Visit: Payer: Self-pay | Admitting: *Deleted

## 2014-05-26 MED ORDER — HYDROCODONE-ACETAMINOPHEN 5-325 MG PO TABS: ORAL_TABLET | ORAL | Status: DC

## 2014-05-26 NOTE — Telephone Encounter (Signed)
[facility] 

## 2014-06-15 ENCOUNTER — Non-Acute Institutional Stay (SKILLED_NURSING_FACILITY): Payer: PRIVATE HEALTH INSURANCE | Admitting: Internal Medicine

## 2014-06-15 ENCOUNTER — Encounter: Payer: Self-pay | Admitting: Internal Medicine

## 2014-06-15 DIAGNOSIS — I1310 Hypertensive heart and chronic kidney disease without heart failure, with stage 1 through stage 4 chronic kidney disease, or unspecified chronic kidney disease: Secondary | ICD-10-CM

## 2014-06-15 DIAGNOSIS — E785 Hyperlipidemia, unspecified: Secondary | ICD-10-CM

## 2014-06-15 DIAGNOSIS — F0280 Dementia in other diseases classified elsewhere without behavioral disturbance: Secondary | ICD-10-CM

## 2014-06-15 DIAGNOSIS — M810 Age-related osteoporosis without current pathological fracture: Secondary | ICD-10-CM

## 2014-06-15 DIAGNOSIS — N183 Chronic kidney disease, stage 3 unspecified: Secondary | ICD-10-CM

## 2014-06-15 DIAGNOSIS — G309 Alzheimer's disease, unspecified: Secondary | ICD-10-CM

## 2014-06-15 NOTE — Progress Notes (Signed)
Patient ID: Margrett RudMargaret Mainwaring, female   DOB: February 02, 1920, 80 y.o.   MRN: [id number]    [facility]  Cc-routine visit  Allergies reviewed  HPI 80 y/o female patient is a long term resident here with dementia and is at her baseline. No new concern from staff. wight is low but remains stable. Difficult to obtain ROS from patient but she reports that her pain is under control  ROS unbale to obtain Appears comfortable No falls or new skin/ behavior concern  Past Medical History  Diagnosis Date  . Other vitamin B12 deficiency anemia   . Alzheimer's disease   . Other and unspecified hyperlipidemia   . Tear film insufficiency, unspecified   . Allergic rhinitis, cause unspecified   . Unspecified hypertensive heart and kidney disease without heart failure and with chronic kidney disease stage I through stage IV, or unspecified   . Senile osteoporosis   . Iron deficiency anemia, unspecified   . Peripheral vascular disease, unspecified   . Chronic kidney disease, stage III (moderate)   . Generalized osteoarthrosis, involving multiple sites   . Unspecified psychosis   . HTN (hypertension)   . Hyperlipidemia   . CKD (chronic kidney disease) stage 3, GFR 30-59 ml/min   . Unspecified protein-calorie malnutrition    [facility]ication [facility]. [facility]  Physical exam BP 123/66  Pulse 60  Temp(Src) 98.2 F (36.8 C)  Resp 18  Wt 120 lb 12.8 oz (54.795 kg)  SpO2 95%  Constitutional: Frail elderly female in no acute distress.   Neck: Normal range of motion. Neck supple. No lymphadenopathy.  Pulmonary/Chest: No respiratory distress. Clear to auscultation bilaterally.   Cardiovascular: Normal rate and rhythm. No murmurs, rubs, or gallops. Diminished pedal pulses. No edema noted. Abdominal: Soft. Bowel sounds present. No tenderness Musculoskeletal: Normal range of motion. Nontender to palpation     Skin: Skin is warm and dry. She is not diaphoretic.    Psychiatric: She has a normal mood and affect. Her behavior is normal.   labs 06/07/13 wbc 8.1, hb 10.8, hct 36.6, plt 316, na 148, k 4.4, bun 50, cr 2.0, glu 106, ca 9.2, lft wnl 10/07/13 wbc 5.6, hb 10.1, hct 33.3, plt 253, na 138, k 4.8, bun 38, cr 1, glu 83, ca 9 01/02/14 wbc 5.6, hb 11.4, na 143, k 4.9, bun 31, cr 0.9, glu 84, ca 9  Assessment/Plan:  OA Continue her norco 5-325 tid and tylenol and continue miralax to help with BM  Psychosis Calm with current regimen, continue namenda  Hyperlipidemia Continue zocor 5 mg daily for now

## 2014-06-15 NOTE — Progress Notes (Signed)
Patient ID: Janice Turner, female   DOB: 04-29-1920, 80 y.o.   MRN: [id number]    [facility]ility[facility]  Chief Complaint  Patient presents with  . Medical Management of Chronic Issues   Allergies  Allergen Reactions  . Aricept [Donepezil Hcl]   . Naproxen   . Sulfonamide Derivatives     REACTION: rash   HPI 80 y/o female patient is a long term resident here. She has been at her baseline with advanced dementia and there are no ongoing concerns.  ROS Unable to obtain from patient No falls reported.  Weight remains stable.  Denies any shortness of breath and chest pain.  BP reading stable  Past Medical History  Diagnosis Date  . Other vitamin B12 deficiency anemia   . Alzheimer's disease   . Other and unspecified hyperlipidemia   . Tear film insufficiency, unspecified   . Allergic rhinitis, cause unspecified   . Unspecified hypertensive heart and kidney disease without heart failure and with chronic kidney disease stage I through stage IV, or unspecified   . Senile osteoporosis   . Iron deficiency anemia, unspecified   . Peripheral vascular disease, unspecified   . Chronic kidney disease, stage III (moderate)   . Generalized osteoarthrosis, involving multiple sites   . Unspecified psychosis   . HTN (hypertension)   . Hyperlipidemia   . CKD (chronic kidney disease) stage 3, GFR 30-59 ml/min   . Unspecified protein-calorie malnutrition    Current Outpatient Prescriptions on File Prior to Visit  Medication Sig Dispense Refill  . calcium-vitamin D (OS-CAL 500 + D) 500-200 MG-UNIT per tablet Take 1 tablet by mouth 2 (two) times daily.      . Cholecalciferol 2000 UNITS CAPS Take 1 capsule by mouth daily.      Marland Kitchen docusate sodium (COLACE) 100 MG capsule Take 100 mg by mouth daily.      . ferrous sulfate 220 (44 FE) MG/5ML solution Take 220 mg by mouth 2 (two) times daily.       . fluticasone (FLONASE) 50 MCG/ACT nasal spray Place 2 sprays into  the nose daily.      Marland Kitchen HYDROcodone-acetaminophen (NORCO/VICODIN) 5-325 MG per tablet Take one tablet by mouth three times daily for pain  90 tablet  0  . loratadine (CLARITIN) 10 MG tablet Take 10 mg by mouth daily.      Marland Kitchen LORazepam (ATIVAN) 1 MG tablet Take one tablet by mouth 30 minutes prior to dental procedure on 12/15/2013  1 tablet  1  . Memantine HCl ER (NAMENDA XR) 28 MG CP24 Take 28 mg by mouth daily.      Marland Kitchen omeprazole (PRILOSEC) 20 MG capsule Take 10 mg by mouth daily.       . polyethylene glycol (MIRALAX / GLYCOLAX) packet Take 17 g by mouth daily.      . simvastatin (ZOCOR) 5 MG tablet Take 5 mg by mouth at bedtime.      . vitamin B-12 (CYANOCOBALAMIN) 1000 MCG tablet Take 1,000 mcg by mouth daily.       No current facility-administered medications on file prior to visit.   Physical exam BP 119/76  Pulse 60  Temp(Src) 97.8 F (36.6 C)  Resp 18  Wt 122 lb 9.6 oz (55.611 kg)  SpO2 96%  Constitutional: Frail elderly female in no acute distress.  Head: Normocephalic and atraumatic.   Mouth/Throat: Oropharynx is clear and moist.   Eyes: Conjunctivae clear. EOM intact. Pupils are equal, round,  and reactive to light.   Neck: Normal range of motion. Neck supple. No lymphadenopathy. No JVD. No carotid bruit.  Pulmonary/Chest: No respiratory distress. Clear to auscultation bilaterally.   Cardiovascular: Normal rate and rhythm. No murmurs, rubs, or gallops. Diminished pedal pulses. No edema noted. Abdominal: Soft. Bowel sounds present. No tenderness Musculoskeletal: Normal range of motion. Nontender to palpation. Kyphosis. propelled in wheelchair by others. Assistance with transfers Neurological: She is alert and interactive.   Skin: Skin is warm and dry. She is not diaphoretic.  Psychiatric: She has a normal mood and affect. Her behavior is normal.   labs 06/07/13 wbc 8.1, hb 10.8, hct 36.6, plt 316, na 148, k 4.4, bun 50, cr 2.0, glu 106, ca 9.2, lft wnl 10/07/13 wbc 5.6, hb  10.1, hct 33.3, plt 253, na 138, k 4.8, bun 38, cr 1, glu 83, ca 9 01/02/14 wbc 5.6, hb 11.4, na 143, k 4.9, bun 31, cr 0.9, glu 84, ca 9 04/06/14 wbc 6.4 hb 10.9, mcv 98.9, plt 222, na 141, k 4.7, cl 108, glu 86, bun 38, cr 1.1  Assessment/Plan:  Senile osteoporosis Continue her oscal and vit d, fall precautions  ckd stage 3 Stable, off all bp med and bp at goal.   alzhimer's disease Continue her namenda and monitor clinically. Decline anticipated and comfort is main goal of care. Skin care. Fall precautions. Monitor oral intake   HTN Off all meds and bp stable  Hyperlipidemia Continue zocor 5 mg daily

## 2014-06-26 ENCOUNTER — Other Ambulatory Visit: Payer: Self-pay | Admitting: *Deleted

## 2014-06-26 MED ORDER — HYDROCODONE-ACETAMINOPHEN 5-325 MG PO TABS: ORAL_TABLET | ORAL | Status: DC

## 2014-06-26 NOTE — Telephone Encounter (Signed)
[facility] 

## 2014-07-10 ENCOUNTER — Non-Acute Institutional Stay (SKILLED_NURSING_FACILITY): Payer: PRIVATE HEALTH INSURANCE | Admitting: Internal Medicine

## 2014-07-10 ENCOUNTER — Encounter: Payer: Self-pay | Admitting: Internal Medicine

## 2014-07-10 DIAGNOSIS — E785 Hyperlipidemia, unspecified: Secondary | ICD-10-CM

## 2014-07-10 DIAGNOSIS — G309 Alzheimer's disease, unspecified: Principal | ICD-10-CM

## 2014-07-10 DIAGNOSIS — F0280 Dementia in other diseases classified elsewhere without behavioral disturbance: Secondary | ICD-10-CM

## 2014-07-10 DIAGNOSIS — I739 Peripheral vascular disease, unspecified: Secondary | ICD-10-CM

## 2014-07-10 NOTE — Progress Notes (Signed)
Patient ID: Janice Turner, female   DOB: 14-Mar-1920, 80 y.o.   MRN: [id number]  Location:  [facility]  Provider:  Oneal Grout, MD  Code Status:  DNR  Chief Complaint  Patient presents with  . Medical Management of Chronic Issues    HPI:  80 y/o female patient is seen for routine visit. She has advanced dementia and there are no ongoing concerns.No falls reported. Weight remains stable. Difficult to obtain ROS from patient   Review of Systems:  Unable to obtain  Medications: Patient's Medications  New Prescriptions   No medications on file  Previous Medications   CALCIUM-VITAMIN D (OS-CAL 500 + D) 500-200 MG-UNIT PER TABLET    Take 1 tablet by mouth 2 (two) times daily.   CHOLECALCIFEROL 2000 UNITS CAPS    Take 1 capsule by mouth daily.   DOCUSATE SODIUM (COLACE) 100 MG CAPSULE    Take 100 mg by mouth daily. 10 ml =100 mg   FERROUS SULFATE 220 (44 FE) MG/5ML SOLUTION    Take 220 mg by mouth 2 (two) times daily.    FLUTICASONE (FLONASE) 50 MCG/ACT NASAL SPRAY    Place 2 sprays into the nose daily.   HYDROCODONE-ACETAMINOPHEN (NORCO/VICODIN) 5-325 MG PER TABLET    Take one tablet by mouth three times daily for pain   LORATADINE (CLARITIN) 10 MG TABLET    Take 10 mg by mouth daily.   MEMANTINE HCL ER (NAMENDA XR) 28 MG CP24    Take 28 mg by mouth daily.   OMEPRAZOLE (PRILOSEC) 20 MG CAPSULE    Take 10 mg by mouth daily. Take 10 mg daily, cut 20 mg tab in 1/2 to equal 10 mg.   POLYETHYLENE GLYCOL (MIRALAX / GLYCOLAX) PACKET    Take 17 g by mouth daily.   SIMVASTATIN (ZOCOR) 5 MG TABLET    Take 5 mg by mouth at bedtime.   VITAMIN B-12 (CYANOCOBALAMIN) 1000 MCG TABLET    Take 1,000 mcg by mouth daily.  Modified Medications   No medications on file  Discontinued Medications   LORAZEPAM (ATIVAN) 1 MG TABLET    Take one tablet by mouth 30 minutes prior to dental procedure on 12/15/2013    Physical Exam: Filed Vitals:   07/10/14 1114  BP: 136/57  Pulse: 82  Temp:  98.5 F (36.9 C)  Resp: 18  Height:  (1.651 m)  Weight: 122 lb 9.6 oz (55.611 kg)   Constitutional: Frail elderly female in no acute distress.  Head: Normocephalic and atraumatic.   Mouth/Throat: Oropharynx is clear and moist.   Neck: Normal range of motion. Neck supple. No lymphadenopathy. Pulmonary/Chest: No respiratory distress. Clear to auscultation bilaterally.   Cardiovascular: Normal rate and rhythm. No murmurs, rubs, or gallops. Diminished pedal pulses. No edema noted. Abdominal: Soft. Bowel sounds present. No tenderness Musculoskeletal: Normal range of motion. Nontender to palpation   Neurological: She is alert and interactive.   Skin: Skin is warm and dry. She is not diaphoretic.  Psychiatric: She has a normal mood and affect. Her behavior is normal.    Labs reviewed: Basic Metabolic Panel:  Recent Labs  95/28/41  NA 141  K 4.7  BUN 38*    Liver Function Tests:  Recent Labs  04/06/14  AST 13  ALT 6*  ALKPHOS 72    CBC:  Recent Labs  04/06/14  WBC 6.4  HGB 10.9*  HCT 36  PLT 222    Assessment/Plan:  Alzheimer's disease Ongoing. Slow  decline anticipated. Continue Namenda XR  PO QD.   PVD No open wound/ sore. Continue barrier cream, pressure ulcer prophylaxis, gel overlay  Hyperlipidemia Continue zocor current regimen

## 2014-07-25 ENCOUNTER — Other Ambulatory Visit: Payer: Self-pay | Admitting: *Deleted

## 2014-07-25 MED ORDER — HYDROCODONE-ACETAMINOPHEN 5-325 MG PO TABS: ORAL_TABLET | ORAL | Status: DC

## 2014-08-10 ENCOUNTER — Non-Acute Institutional Stay (SKILLED_NURSING_FACILITY): Payer: PRIVATE HEALTH INSURANCE | Admitting: Internal Medicine

## 2014-08-10 DIAGNOSIS — D509 Iron deficiency anemia, unspecified: Secondary | ICD-10-CM

## 2014-08-10 DIAGNOSIS — H04129 Dry eye syndrome of unspecified lacrimal gland: Secondary | ICD-10-CM

## 2014-08-10 DIAGNOSIS — J309 Allergic rhinitis, unspecified: Secondary | ICD-10-CM

## 2014-08-10 NOTE — Progress Notes (Signed)
Patient ID: Janice Turner, female   DOB: 1919-11-21, 80 y.o.   MRN: [id number]    Facility: [facility]  Chief Complaint  Patient presents with  . Medical Management of Chronic Issues   Allergies  Allergen Reactions  . Aricept [Donepezil Hcl]   . Naproxen   . Sulfonamide Derivatives     REACTION: rash   HPI 80 y/o female pt is seen for routine visit. She has advanced dementia, severe kyphosis and has been at her baseline. No new nursing concerns. She is up in her wheelchair. Unable to obtain ROS from patient  ROS Unable to obtain  Past Medical History  Diagnosis Date  . Other vitamin B12 deficiency anemia   . Alzheimer's disease   . Other and unspecified hyperlipidemia   . Tear film insufficiency, unspecified   . Allergic rhinitis, cause unspecified   . Unspecified hypertensive heart and kidney disease without heart failure and with chronic kidney disease stage I through stage IV, or unspecified   . Senile osteoporosis   . Iron deficiency anemia, unspecified   . Peripheral vascular disease, unspecified   . Chronic kidney disease, stage III (moderate)   . Generalized osteoarthrosis, involving multiple sites   . Unspecified psychosis   . HTN (hypertension)   . Hyperlipidemia   . CKD (chronic kidney disease) stage 3, GFR 30-59 ml/min   . Unspecified protein-calorie malnutrition    Current Outpatient Prescriptions on File Prior to Visit  Medication Sig Dispense Refill  . calcium-vitamin D (OS-CAL 500 + D) 500-200 MG-UNIT per tablet Take 1 tablet by mouth 2 (two) times daily.      . Cholecalciferol 2000 UNITS CAPS Take 1 capsule by mouth daily.      Marland Kitchen docusate sodium (COLACE) 100 MG capsule Take 100 mg by mouth daily. 10 ml =100 mg      . ferrous sulfate 220 (44 FE) MG/5ML solution Take 220 mg by mouth 2 (two) times daily.       . fluticasone (FLONASE) 50 MCG/ACT nasal spray Place 2 sprays into the nose daily.      Marland Kitchen  HYDROcodone-acetaminophen (NORCO/VICODIN) 5-325 MG per tablet Take one tablet by mouth three times daily for pain  90 tablet  0  . loratadine (CLARITIN) 10 MG tablet Take 10 mg by mouth daily.      . Memantine HCl ER (NAMENDA XR) 28 MG CP24 Take 28 mg by mouth daily.      Marland Kitchen omeprazole (PRILOSEC) 20 MG capsule Take 10 mg by mouth daily. Take 10 mg daily, cut 20 mg tab in 1/2 to equal 10 mg.      . polyethylene glycol (MIRALAX / GLYCOLAX) packet Take 17 g by mouth daily.      . simvastatin (ZOCOR) 5 MG tablet Take 5 mg by mouth at bedtime.      . vitamin B-12 (CYANOCOBALAMIN) 1000 MCG tablet Take 1,000 mcg by mouth daily.       No current facility-administered medications on file prior to visit.    Physical exam BP 139/79  Pulse 78  Temp(Src) 97 F (36.1 C)  Resp 18  Wt 122 lb (55.339 kg)  SpO2 97%  Constitutional: Frail elderly female in no acute distress.   Head: Normocephalic and atraumatic.   Mouth/Throat: Oropharynx is clear and moist.   Eyes: Conjunctivae clear. EOM intact. Pupils are equal, round, and reactive to light.   Neck: Normal range of motion. Neck supple. No lymphadenopathy. No JVD.  No carotid bruit.  Pulmonary/Chest: No respiratory distress. Clear to auscultation bilaterally.   Cardiovascular: Normal rate and rhythm. No murmurs, rubs, or gallops. Diminished pedal pulses. No edema noted. Abdominal: Soft. Bowel sounds present. No tenderness Musculoskeletal: Normal range of motion. Nontender to palpation   Neurological: She is alert and interactive.   Skin: Skin is warm and dry. She is not diaphoretic.  Psychiatric: She has a normal mood and affect. Her behavior is normal.   06/07/13 wbc 8.1, hb 10.8, hct 36.6, plt 316, na 148, k 4.4, bun 50, cr 2.0, glu 106, ca 9.2, lft wnl 10/07/13 wbc 5.6, hb 10.1, hct 33.3, plt 253, na 138, k 4.8, bun 38, cr 1, glu 83, ca 9 01/02/14 wbc 5.6, hb 11.4, na 143, k 4.9, bun 31, cr 0.9, glu 84, ca 9 04/06/14 wbc 6.4 hb 10.9, mcv 98.9, plt  222, na 141, k 4.7, cl 108, glu 86, bun 38, cr 1.1  Assessment/Plan:  Iron def anemia Continue ferrous sulfate bid, monitor cbc  Allergic rhinitis Continue claritin and flonase. Monitor for now.   Tear film insufficiency Continue systane eye drops

## 2014-08-25 ENCOUNTER — Other Ambulatory Visit: Payer: Self-pay | Admitting: *Deleted

## 2014-08-25 MED ORDER — HYDROCODONE-ACETAMINOPHEN 5-325 MG PO TABS: ORAL_TABLET | ORAL | Status: DC

## 2014-08-25 NOTE — Telephone Encounter (Signed)
[facility] 

## 2014-09-08 ENCOUNTER — Encounter: Payer: Self-pay | Admitting: Internal Medicine

## 2014-09-08 ENCOUNTER — Non-Acute Institutional Stay (SKILLED_NURSING_FACILITY): Payer: PRIVATE HEALTH INSURANCE | Admitting: Internal Medicine

## 2014-09-08 DIAGNOSIS — F0280 Dementia in other diseases classified elsewhere without behavioral disturbance: Secondary | ICD-10-CM

## 2014-09-08 DIAGNOSIS — D539 Nutritional anemia, unspecified: Secondary | ICD-10-CM

## 2014-09-08 DIAGNOSIS — K5900 Constipation, unspecified: Secondary | ICD-10-CM

## 2014-09-08 DIAGNOSIS — G309 Alzheimer's disease, unspecified: Secondary | ICD-10-CM

## 2014-09-08 DIAGNOSIS — E785 Hyperlipidemia, unspecified: Secondary | ICD-10-CM

## 2014-09-08 NOTE — Progress Notes (Signed)
Patient ID: Janice RudMargaret Turner, female   DOB: Aug 16, 1920, 80 y.o.   MRN: [id number]   [facility]  Allergies  Allergen Reactions  . Aricept [Donepezil Hcl]   . Naproxen   . Sulfonamide Derivatives     REACTION: rash    Code Status: DNR  Goals of Care: Comfort and Quality of Life/Long term care  Chief Complaint  Patient presents with  . Medical Management of Chronic Issues    AD, anemia, HLD, constipation    HPI 80 y.o. female with PMH of Alzheimer's dementia, anemia, HLD, and chronic constipation among others is being seen for a routine visit. No complaints verbalized from patients. No falls or skin issues reported. No change in functional status or change in behavior reported. Weight stable. No concerns from nursing staff.   Review of Systems Unable to obtain due to dementia  Past Medical History  Diagnosis Date  . Other vitamin B12 deficiency anemia   . Alzheimer's disease   . Other and unspecified hyperlipidemia   . Tear film insufficiency, unspecified   . Allergic rhinitis, cause unspecified   . Unspecified hypertensive heart and kidney disease without heart failure and with chronic kidney disease stage I through stage IV, or unspecified   . Senile osteoporosis   . Iron deficiency anemia, unspecified   . Peripheral vascular disease, unspecified   . Chronic kidney disease, stage III (moderate)   . Generalized osteoarthrosis, involving multiple sites   . Unspecified psychosis   . HTN (hypertension)   . Hyperlipidemia   . CKD (chronic kidney disease) stage 3, GFR 30-59 ml/min   . Unspecified protein-calorie malnutrition     No past surgical history on file.  History   Social History  . Marital Status: Divorced    Spouse Name: N/A    Number of Children: N/A  . Years of Education: N/A   Occupational History  . Not on file.   Social History Main Topics  . Smoking status: Never Smoker   . Smokeless tobacco: Not on file  .  Alcohol Use: No  . Drug Use: No  . Sexual Activity: Not Currently   Other Topics Concern  . Not on file   Social History Narrative  . No narrative on file      Medication List       This list is accurate as of: 09/08/14 12:07 PM.  Always use your most recent med list.               Cholecalciferol 2000 UNITS Caps  Take 1 capsule by mouth daily.     COMBIGAN 0.2-0.5 % ophthalmic solution  Generic drug:  brimonidine-timolol  Place 1 drop into both eyes every 12 (twelve) hours.     docusate sodium 100 MG capsule  Commonly known as:  COLACE  Take 100 mg by mouth daily. 10 ml =100 mg     ferrous sulfate 220 (44 FE) MG/5ML solution  Take 220 mg by mouth 2 (two) times daily.     HYDROcodone-acetaminophen 5-325 MG per tablet  Commonly known as:  NORCO/VICODIN  Take one tablet by mouth three times daily for pain     loratadine 10 MG tablet  Commonly known as:  CLARITIN  Take 10 mg by mouth daily.     NAMENDA XR 28 MG Cp24  Generic drug:  Memantine HCl ER  Take 28 mg by mouth daily.     omeprazole 20 MG capsule  Commonly known  as:  PRILOSEC  Take 10 mg by mouth daily. Take 10 mg daily, cut 20 mg tab in 1/2 to equal 10 mg.     OS-CAL 500 + D 500-200 MG-UNIT per tablet  Generic drug:  calcium-vitamin D  Take 1 tablet by mouth 2 (two) times daily.     polyethylene glycol packet  Commonly known as:  MIRALAX / GLYCOLAX  Take 17 g by mouth daily.     simvastatin 5 MG tablet  Commonly known as:  ZOCOR  Take 5 mg by mouth at bedtime.     SYSTANE 0.4-0.3 % Soln  Generic drug:  Polyethyl Glycol-Propyl Glycol  Place 1 drop into both eyes 4 (four) times daily as needed.     vitamin B-12 1000 MCG tablet  Commonly known as:  CYANOCOBALAMIN  Take 1,000 mcg by mouth daily.        Physical Exam Filed Vitals:   09/08/14 1158  BP: 130/96  Pulse: 90  Temp: 98.3 F (36.8 C)  Resp: 18   Constitutional: WDWN elderly female in no acute distress. Interactive.  HEENT:  Normocephalic and atraumatic. PERRL. EOM intact. No icterus. Oral mucosa moist. Posterior pharynx clear of any exudate or lesions.  Neck: Supple and nontender. No lymphadenopathy, masses, or thyromegaly. No JVD or carotid bruits. Cardiac: Normal S1, S2. RRR without appreciable murmurs, rubs, or gallops. Intact pulses intact. No dependent edema.  Lungs: No respiratory distress. Breath sounds clear bilaterally without rales, rhonchi, or wheezes. Abdomen: Audible bowel sounds in all quadrants. Soft, nontender, nondistended. No palpable mass.  Musculoskeletal: limited ROM. No joint erythema or tenderness. Spine and Back: marked kyphosis. No tenderness over spines.  Skin: Warm and dry. No rash noted. No erythema.  Neurological: Alert, able follow simple commands Psychiatric: has dementia   Labs Reviewed CBC Latest Ref Rng 04/06/2014 03/08/2011 03/08/2011  WBC - 6.4 10.3 10.4  Hemoglobin 12.0 - 16.0 g/dL 10.9(A) 9.9(L) 10.3(L)  Hematocrit 36 - 46 % 36 32.1(L) 33.8(L)  Platelets 150 - 399 K/L 222 315 320     Chemistry      Component Value Date/Time   NA 141 04/06/2014   NA 142 03/07/2011 0449   K 4.7 04/06/2014   CL 112 03/07/2011 0449   CO2 24 03/07/2011 0449   BUN 38* 04/06/2014   BUN 26* 03/07/2011 0449   CREATININE 1.14 03/07/2011 0449   GLU 86 04/06/2014      Component Value Date/Time   CALCIUM 8.3* 03/07/2011 0449   ALKPHOS 72 04/06/2014   AST 13 04/06/2014   ALT 6* 04/06/2014   BILITOT 0.5 03/07/2011 0449     Lipid Panel     Component Value Date/Time   CHOL 178 04/06/2014   TRIG 218* 04/06/2014   HDL 34* 04/06/2014   CHOLHDL 3.7 CALC 10/10/2008 1017   VLDL 26 10/10/2008 1017   LDLCALC 100 04/06/2014   LDLDIRECT 135.6 10/10/2008 1017    Assessment & Plan 1. Alzheimer's disease Advanced, decline anticipated. Continue Namenda XR 28mg  daily and monitor for change in behaviors. Continue fall risk and pressure ulcer precautions.   2. Deficiency anemia Stable. Continue iron and vitamin  b12 supplements and monitor.   3. Hyperlipidemia Stable. Continue zocor 5mg  daily and monitor.   4. Constipation, unspecified constipation type Stable. Continue bowel management with colace 100mg  daily and miralax 17g daily and monitor.   Family/Staff Communication Plan of care discuss with nursing staff members. Nursing staff verbalize understanding and agree with plan of care. No  additional questions or concerns reported.    Loura BackKim Nguyen, MSN, AGNP-C [facility] [address] [place], [place] [address] [phone] [8am-5pm] After hours: [phone]  I have personally reviewed this note and agree with the care plan  [facility] Kirsta Probert, MD  [facility] [phone] (Monday-Friday 8 am - 5 pm) [phone] (afterhours)

## 2014-09-25 ENCOUNTER — Other Ambulatory Visit: Payer: Self-pay | Admitting: *Deleted

## 2014-09-25 MED ORDER — HYDROCODONE-ACETAMINOPHEN 5-325 MG PO TABS: ORAL_TABLET | ORAL | Status: DC

## 2014-09-25 NOTE — Telephone Encounter (Signed)
[facility] 

## 2014-10-12 ENCOUNTER — Non-Acute Institutional Stay (SKILLED_NURSING_FACILITY): Payer: PRIVATE HEALTH INSURANCE | Admitting: Internal Medicine

## 2014-10-12 DIAGNOSIS — M150 Primary generalized (osteo)arthritis: Secondary | ICD-10-CM

## 2014-10-12 DIAGNOSIS — I739 Peripheral vascular disease, unspecified: Secondary | ICD-10-CM

## 2014-10-12 DIAGNOSIS — H04123 Dry eye syndrome of bilateral lacrimal glands: Secondary | ICD-10-CM

## 2014-10-12 NOTE — Progress Notes (Signed)
Patient ID: Janice RudMargaret Turner, female   DOB: 08/30/1920, 80 y.o.   MRN: [id number]    [facility]  Chief complaint- routine monthly visit  Allergies reviewed- aricpet, naproxen, sulfa drugs  HPI 80 y/o female patient is a long term resident here. She has been at her baseline with advanced dementia. No falls reported. Weight remains stable. Difficult to obtain ROS from patient. No new concern from staff  ROS Unable to obtain with dementia  Past Medical History  Diagnosis Date  . Other vitamin B12 deficiency anemia   . Alzheimer's disease   . Other and unspecified hyperlipidemia   . Tear film insufficiency, unspecified   . Allergic rhinitis, cause unspecified   . Unspecified hypertensive heart and kidney disease without heart failure and with chronic kidney disease stage I through stage IV, or unspecified   . Senile osteoporosis   . Iron deficiency anemia, unspecified   . Peripheral vascular disease, unspecified   . Chronic kidney disease, stage III (moderate)   . Generalized osteoarthrosis, involving multiple sites   . Unspecified psychosis   . HTN (hypertension)   . Hyperlipidemia   . CKD (chronic kidney disease) stage 3, GFR 30-59 ml/min   . Unspecified protein-calorie malnutrition    Medication reviewed. See [facility]  Physical exam BP 122/76 mmHg  Pulse 68  Temp(Src) 97.6 F (36.4 C)  Resp 18  Wt 129 lb 3.2 oz (58.605 kg)  SpO2 95%  Constitutional: Frail, thin built elderly female in no acute distress.   Head: Normocephalic and atraumatic.   Mouth/Throat: Oropharynx is clear and moist.   Eyes: Conjunctivae clear. EOM intact. Pupils are equal, round, and reactive to light.   Neck: Normal range of motion. Neck supple. No lymphadenopathy. No JVD. No carotid bruit.   Pulmonary/Chest: No respiratory distress. Clear to auscultation bilaterally.   Cardiovascular: Normal rate and rhythm. No murmurs, rubs, or gallops. Diminished  pedal pulses. No edema noted. Abdominal: Soft. Bowel sounds present. No tenderness Musculoskeletal: Normal range of motion. Nontender to palpation. [facility] by others and needs assistance with transfers Neurological: She is alert and interactive.   Skin: Skin is warm and dry. She is not diaphoretic.  Psychiatric: She has a normal mood and affect. Her behavior is normal.   Labs  06/07/13 wbc 8.1, hb 10.8, hct 36.6, plt 316, na 148, k 4.4, bun 50, cr 2.0, glu 106, ca 9.2, lft wnl 10/07/13 wbc 5.6, hb 10.1, hct 33.3, plt 253, na 138, k 4.8, bun 38, cr 1, glu 83, ca 9 01/02/14 wbc 5.6, hb 11.4, na 143, k 4.9, bun 31, cr 0.9, glu 84, ca 9 04/06/14 wbc 6.4 hb 10.9, mcv 98.9, plt 222, na 141, k 4.7, cl 108, glu 86, bun 38, cr 1.1, ca 8.8, lft wnl, t.chol 178, tg 218, ldl 100, hdl 34  Assessment/plan  PVD Chronic, no wounds, stable, on bunny boots while in bed, has gel overlay mattress, continue repositioning, barrier cream, monitor nutritonal intake  OA norco and tylenol has been helpful, monitor bowel movement with pt on narcotics, continue stool softener  Dry eyes On systane eye drops, monitor clinically

## 2014-10-23 ENCOUNTER — Other Ambulatory Visit: Payer: Self-pay | Admitting: *Deleted

## 2014-10-23 MED ORDER — HYDROCODONE-ACETAMINOPHEN 5-325 MG PO TABS: ORAL_TABLET | ORAL | Status: DC

## 2014-10-23 NOTE — Telephone Encounter (Signed)
[facility] 

## 2014-11-16 ENCOUNTER — Non-Acute Institutional Stay (SKILLED_NURSING_FACILITY): Payer: PRIVATE HEALTH INSURANCE | Admitting: Internal Medicine

## 2014-11-16 DIAGNOSIS — N183 Chronic kidney disease, stage 3 (moderate): Secondary | ICD-10-CM

## 2014-11-16 DIAGNOSIS — I1310 Hypertensive heart and chronic kidney disease without heart failure, with stage 1 through stage 4 chronic kidney disease, or unspecified chronic kidney disease: Secondary | ICD-10-CM

## 2014-11-16 DIAGNOSIS — E43 Unspecified severe protein-calorie malnutrition: Secondary | ICD-10-CM

## 2014-11-22 DIAGNOSIS — I739 Peripheral vascular disease, unspecified: Secondary | ICD-10-CM | POA: Insufficient documentation

## 2014-11-22 DIAGNOSIS — H04123 Dry eye syndrome of bilateral lacrimal glands: Secondary | ICD-10-CM | POA: Insufficient documentation

## 2014-11-22 DIAGNOSIS — M150 Primary generalized (osteo)arthritis: Secondary | ICD-10-CM | POA: Insufficient documentation

## 2014-11-24 ENCOUNTER — Other Ambulatory Visit: Payer: Self-pay | Admitting: *Deleted

## 2014-11-24 MED ORDER — HYDROCODONE-ACETAMINOPHEN 5-325 MG PO TABS: ORAL_TABLET | ORAL | Status: DC

## 2014-11-24 NOTE — Telephone Encounter (Signed)
[facility] 

## 2014-12-14 ENCOUNTER — Non-Acute Institutional Stay (SKILLED_NURSING_FACILITY): Payer: Medicare Other | Admitting: Internal Medicine

## 2014-12-14 DIAGNOSIS — J310 Chronic rhinitis: Secondary | ICD-10-CM

## 2014-12-14 DIAGNOSIS — I1310 Hypertensive heart and chronic kidney disease without heart failure, with stage 1 through stage 4 chronic kidney disease, or unspecified chronic kidney disease: Secondary | ICD-10-CM | POA: Insufficient documentation

## 2014-12-14 DIAGNOSIS — E782 Mixed hyperlipidemia: Secondary | ICD-10-CM

## 2014-12-14 DIAGNOSIS — L89312 Pressure ulcer of right buttock, stage 2: Secondary | ICD-10-CM

## 2014-12-14 DIAGNOSIS — N189 Chronic kidney disease, unspecified: Secondary | ICD-10-CM | POA: Insufficient documentation

## 2014-12-14 DIAGNOSIS — F0280 Dementia in other diseases classified elsewhere without behavioral disturbance: Secondary | ICD-10-CM

## 2014-12-14 DIAGNOSIS — G309 Alzheimer's disease, unspecified: Secondary | ICD-10-CM

## 2014-12-14 DIAGNOSIS — M810 Age-related osteoporosis without current pathological fracture: Secondary | ICD-10-CM

## 2014-12-14 DIAGNOSIS — E43 Unspecified severe protein-calorie malnutrition: Secondary | ICD-10-CM | POA: Insufficient documentation

## 2014-12-14 NOTE — Progress Notes (Signed)
Patient ID: Janice RudMargaret Turner, female   DOB: [birth year], 81 y.o.   MRN: [id number]    [facility]  Chief complaint- routine monthly visit  Allergies reviewed- aricpet, naproxen, sulfa drugs  HPI 81 y/o female patient is a long term resident here. She has been at her baseline with advanced dementia. No falls reported. Weight remains stable. Difficult to obtain ROS from patient. No new concern from staff  ROS Unable to obtain with dementia She is under total care  Past Medical History  Diagnosis Date  . Other vitamin B12 deficiency anemia   . Alzheimer's disease   . Other and unspecified hyperlipidemia   . Tear film insufficiency, unspecified   . Allergic rhinitis, cause unspecified   . Unspecified hypertensive heart and kidney disease without heart failure and with chronic kidney disease stage I through stage IV, or unspecified   . Senile osteoporosis   . Iron deficiency anemia, unspecified   . Peripheral vascular disease, unspecified   . Chronic kidney disease, stage III (moderate)   . Generalized osteoarthrosis, involving multiple sites   . Unspecified psychosis   . HTN (hypertension)   . Hyperlipidemia   . CKD (chronic kidney disease) stage 3, GFR 30-59 ml/min   . Unspecified protein-calorie malnutrition    Medication reviewed. See [facility]  Physical exam BP 130/76 mmHg  Pulse 72  Temp(Src) 97.8 F (36.6 C)  Resp 16  Constitutional: Frail, thin built elderly female in no acute distress.     Mouth/Throat: Oropharynx is clear and moist.   Pulmonary/Chest: No respiratory distress. Clear to auscultation bilaterally.   Cardiovascular: Normal rate and rhythm. No murmurs, rubs, or gallops. Diminished pedal pulses. No edema noted. Abdominal: Soft. Bowel sounds present. No tenderness Musculoskeletal: Normal range of motion. Nontender to palpation. [facility] in [facility] by others and needs assistance with transfers Neurological: She is alert  and interactive but confused.    Labs  06/07/13 wbc 8.1, hb 10.8, hct 36.6, plt 316, na 148, k 4.4, bun 50, cr 2.0, glu 106, ca 9.2, lft wnl 10/07/13 wbc 5.6, hb 10.1, hct 33.3, plt 253, na 138, k 4.8, bun 38, cr 1, glu 83, ca 9 01/02/14 wbc 5.6, hb 11.4, na 143, k 4.9, bun 31, cr 0.9, glu 84, ca 9 04/06/14 wbc 6.4 hb 10.9, mcv 98.9, plt 222, na 141, k 4.7, cl 108, glu 86, bun 38, cr 1.1, ca 8.8, lft wnl, t.chol 178, tg 218, ldl 100, hdl 34 10/07/14 wbc 8.1, hb 11.9, na 140, k 4.5, bun 31, cr 0.8, glu 82, ca 9.6  Assessment/plan  Protein calorie malnutrition With dementia, decline anticipated. Continue medpass, assitance with ADLs, monitor weight and continue skin care  ckd stage 3 With her long standing HTN, bp stable, off all med, monitor renal function periodically  Hypertensive heart and kidney disease Without known hx of heart failure. Off all bp meds currently, monitor

## 2014-12-14 NOTE — Progress Notes (Signed)
Patient ID: Janice RudMargaret Turner, female   DOB: [birth year], 81 y.o.   MRN: [id number]    [facility]  Chief complaint- routine monthly visit  Allergies reviewed- aricpet, naproxen, sulfa drugs  HPI 81 y/o female patient is a long term resident here. She has been at her baseline with advanced dementia.  Difficult to obtain ROS from patient. No new concern from staff  ROS Unable to obtain with dementia She is under total care No falls reported. Weight remains stable.  Medical history and medications reviewed  Physical exam BP 129/75 mmHg  Pulse 76  Temp(Src) 97.4 F (36.3 C)  Resp 18  SpO2 95%  Constitutional: Frail, thin built elderly female in no acute distress.   Head: Normocephalic and atraumatic.   Mouth/Throat: Oropharynx is clear and moist.   Eyes: Conjunctivae clear. EOM intact. Pupils are equal, round, and reactive to light.   Neck: Normal range of motion. Neck supple. No lymphadenopathy. No JVD. No carotid bruit.   Pulmonary/Chest: No respiratory distress. Clear to auscultation bilaterally.   Cardiovascular: Normal rate and rhythm. No murmurs, rubs, or gallops. Diminished pedal pulses. No edema noted. Abdominal: Soft. Bowel sounds present. No tenderness Musculoskeletal: Normal range of motion. Nontender to palpation. prop[facility] in [facility] by others and needs assistance with transfers. Severe kyphosis Neurological: confused Skin: Skin is warm and dry. She is not diaphoretic. Stage 2 pressure ulcer right buttock.  Psychiatric: poor insight and judgement, can carry a conversation  Labs  06/07/13 wbc 8.1, hb 10.8, hct 36.6, plt 316, na 148, k 4.4, bun 50, cr 2.0, glu 106, ca 9.2, lft wnl 10/07/13 wbc 5.6, hb 10.1, hct 33.3, plt 253, na 138, k 4.8, bun 38, cr 1, glu 83, ca 9 01/02/14 wbc 5.6, hb 11.4, na 143, k 4.9, bun 31, cr 0.9, glu 84, ca 9 04/06/14 wbc 6.4 hb 10.9, mcv 98.9, plt 222, na 141, k 4.7, cl 108, glu 86, bun 38, cr 1.1,  ca 8.8, lft wnl, t.chol 178, tg 218, ldl 100, hdl 34 10/07/14 wbc 8.1, hb 11.9, na 140, k 4.5, bun 31, cr 0.8, glu 82, ca 9.6  Assessment/plan  Alzheimer's disease No behavioral disturbances. Continue current regimen of namenda  Hyperlipidemia Continue zocor 5 mg daily  Age related osteoporosis Severe kyphosis, continue oscal and vitamin d supplement  Stage 2 pressure ulcer Continue wound care and pressure ulcer prophylaxis, with her dementia and protein malnutrition, poor prognosis  Chronic rhinitis Continue claritin 10 mg daily, monitor

## 2014-12-27 ENCOUNTER — Other Ambulatory Visit: Payer: Self-pay | Admitting: *Deleted

## 2014-12-27 MED ORDER — HYDROCODONE-ACETAMINOPHEN 5-325 MG PO TABS
ORAL_TABLET | ORAL | Status: DC
Start: 2014-12-27 — End: 2015-01-29

## 2014-12-27 NOTE — Telephone Encounter (Signed)
[facility] 

## 2015-01-04 ENCOUNTER — Non-Acute Institutional Stay (SKILLED_NURSING_FACILITY): Payer: Medicare Other | Admitting: Internal Medicine

## 2015-01-04 DIAGNOSIS — H4020X0 Unspecified primary angle-closure glaucoma, stage unspecified: Secondary | ICD-10-CM

## 2015-01-04 DIAGNOSIS — I739 Peripheral vascular disease, unspecified: Secondary | ICD-10-CM | POA: Diagnosis not present

## 2015-01-04 DIAGNOSIS — M150 Primary generalized (osteo)arthritis: Secondary | ICD-10-CM

## 2015-01-04 DIAGNOSIS — H04123 Dry eye syndrome of bilateral lacrimal glands: Secondary | ICD-10-CM

## 2015-01-29 ENCOUNTER — Non-Acute Institutional Stay (SKILLED_NURSING_FACILITY): Payer: Medicare Other | Admitting: Internal Medicine

## 2015-01-29 ENCOUNTER — Other Ambulatory Visit: Payer: Self-pay | Admitting: *Deleted

## 2015-01-29 DIAGNOSIS — H4020X0 Unspecified primary angle-closure glaucoma, stage unspecified: Secondary | ICD-10-CM | POA: Insufficient documentation

## 2015-01-29 DIAGNOSIS — D509 Iron deficiency anemia, unspecified: Secondary | ICD-10-CM

## 2015-01-29 DIAGNOSIS — K219 Gastro-esophageal reflux disease without esophagitis: Secondary | ICD-10-CM | POA: Diagnosis not present

## 2015-01-29 DIAGNOSIS — L89152 Pressure ulcer of sacral region, stage 2: Secondary | ICD-10-CM | POA: Diagnosis not present

## 2015-01-29 DIAGNOSIS — H109 Unspecified conjunctivitis: Secondary | ICD-10-CM | POA: Diagnosis not present

## 2015-01-29 DIAGNOSIS — H04123 Dry eye syndrome of bilateral lacrimal glands: Secondary | ICD-10-CM | POA: Insufficient documentation

## 2015-01-29 DIAGNOSIS — L98429 Non-pressure chronic ulcer of back with unspecified severity: Secondary | ICD-10-CM | POA: Insufficient documentation

## 2015-01-29 MED ORDER — HYDROCODONE-ACETAMINOPHEN 5-325 MG PO TABS: ORAL_TABLET | ORAL | Status: DC

## 2015-01-29 NOTE — Progress Notes (Signed)
Patient ID: Janice RudMargaret Turner, female   DOB: 30-May-1920, 81 y.o.   MRN: [id number]    [facility]  Chief complaint- routine monthly visit  Allergies reviewed- aricpet, naproxen, sulfa drugs  HPI 81 y/o patient with advanced dementia is seen today for routine visit. She has been at her baseline.  Difficult to obtain ROS from patient. No new concern from staff. She completed 1 week course of keflex for eye infection. This has now resolved. She is getting wound care for stage 2 sacral ulcer.  ROS She is under total care No falls reported. Weight remains stable.  Medical history and medications reviewed  Physical exam BP 110/63 mmHg  Pulse 65  Temp(Src) 96.5 F (35.8 C)  Resp 18  SpO2 96%  Constitutional: Frail, thin built elderly female in no acute distress.   Head: Normocephalic and atraumatic.   Mouth/Throat: Oropharynx is clear and moist.   Eyes: Conjunctivae clear. EOM intact. Pupils are equal, round, and reactive to light.   Neck: Normal range of motion. Neck supple. No lymphadenopathy. No JVD. No carotid bruit.   Pulmonary/Chest: No respiratory distress. Clear to auscultation bilaterally.   Cardiovascular: Normal rate and rhythm. No murmurs, rubs, or gallops. Diminished pedal pulses. No edema noted. Abdominal: Soft. Bowel sounds present. No tenderness Musculoskeletal: Normal range of motion. Nontender to palpation. [facility] others and needs assistance with transfers. Severe kyphosis Neurological: confused Skin: Skin is warm and dry. She is not diaphoretic. Psychiatric: poor insight and judgement, can carry a conversation  Labs  06/07/13 wbc 8.1, hb 10.8, hct 36.6, plt 316, na 148, k 4.4, bun 50, cr 2.0, glu 106, ca 9.2, lft wnl 10/07/13 wbc 5.6, hb 10.1, hct 33.3, plt 253, na 138, k 4.8, bun 38, cr 1, glu 83, ca 9 01/02/14 wbc 5.6, hb 11.4, na 143, k 4.9, bun 31, cr 0.9, glu 84, ca 9 04/06/14 wbc 6.4 hb 10.9, mcv 98.9,  plt 222, na 141, k 4.7, cl 108, glu 86, bun 38, cr 1.1, ca 8.8, lft wnl, t.chol 178, tg 218, ldl 100, hdl 34 10/07/14 wbc 8.1, hb 11.9, na 140, k 4.5, bun 31, cr 0.8, glu 82, ca 9.6 12/12/14 b12 749  Assessment/plan  Stage 2 pressure ulcer Recurrent. Continue wound care and pressure ulcer prophylaxis, with her dementia and protein malnutrition, poor prognosis  Conjunctivitis Complete course of keflex and symptom resolved  gerd On prilosec 10 mg daily, this increases risk of worsening her osteoporosis. If pt can tolerate it, will need to consider taking her off PPI. Will have this reviewed with family.  Iron def anemia Continue ferrous sulfate solution bid for now

## 2015-01-29 NOTE — Progress Notes (Signed)
Patient ID: Janice Turner, female   DOB: 12/22/1919, 81 y.o.   MRN: [id number]    [facility]  Chief complaint- routine monthly visit  Allergies reviewed- aricpet, naproxen, sulfa drugs  HPI 81 y/o female patient seen for RV. She is at her baseline with advanced dementia.    ROS Unable to obtain with dementia She is under total care No falls reported. Weight remains stable. Dry eyes with systane Stage 2 sacral pressure sore present  Medical history and medications reviewed  Physical exam BP 133/72 mmHg  Pulse 78  Temp(Src) 96.8 F (36 C)  Resp 18  Constitutional: Frail, thin built elderly female in no acute distress.   Head: Normocephalic and atraumatic.    Neck: Normal range of motion. Neck supple. No lymphadenopathy. No JVD.  Pulmonary/Chest: No respiratory distress. Clear to auscultation bilaterally.   Cardiovascular: Normal rate and rhythm. No murmurs, rubs, or gallops. Diminished pedal pulses. No edema noted. Abdominal: Soft. Bowel sounds present. No tenderness Musculoskeletal: Normal range of motion. [facility] others and needs assistance with transfers. Severe kyphosis Neurological: confused Skin: Skin is warm and dry. Stage 2 pressure ulcer right buttock.  Psychiatric: poor insight and judgement, can carry a conversation  Labs  06/07/13 wbc 8.1, hb 10.8, hct 36.6, plt 316, na 148, k 4.4, bun 50, cr 2.0, glu 106, ca 9.2, lft wnl 10/07/13 wbc 5.6, hb 10.1, hct 33.3, plt 253, na 138, k 4.8, bun 38, cr 1, glu 83, ca 9 01/02/14 wbc 5.6, hb 11.4, na 143, k 4.9, bun 31, cr 0.9, glu 84, ca 9 04/06/14 wbc 6.4 hb 10.9, mcv 98.9, plt 222, na 141, k 4.7, cl 108, glu 86, bun 38, cr 1.1, ca 8.8, lft wnl, t.chol 178, tg 218, ldl 100, hdl 34 10/07/14 wbc 8.1, hb 11.9, na 140, k 4.5, bun 31, cr 0.8, glu 82, ca 9.6  Assessment/plan  Dry eye Continue systane eye drops  PVD Chronic, has bunny boots, continue barrier cream,  frequent repositioning  OA Stable on norco 5-325 and tylenol current regimen  Glaucoma Continue her [place]

## 2015-01-29 NOTE — Telephone Encounter (Signed)
[facility] 

## 2015-02-06 ENCOUNTER — Other Ambulatory Visit: Payer: Self-pay | Admitting: *Deleted

## 2015-02-06 MED ORDER — HYDROCODONE-ACETAMINOPHEN 5-325 MG PO TABS: ORAL_TABLET | ORAL | Status: AC

## 2015-02-06 NOTE — Telephone Encounter (Signed)
[facility] 

## 2015-02-14 ENCOUNTER — Other Ambulatory Visit: Payer: Self-pay

## 2015-02-14 MED ORDER — LORAZEPAM 2 MG/ML PO CONC: 1.0000 mg | ORAL | Status: AC | PRN

## 2015-02-14 MED ORDER — MORPHINE SULFATE (CONCENTRATE) 20 MG/ML PO SOLN: ORAL | Status: AC

## 2015-02-14 NOTE — Telephone Encounter (Signed)
Rx faxed to [facility] @ [phone], phone number [phone]  

## 2015-02-16 DEATH — deceased
